# Patient Record
Sex: Male | Born: 1980
Health system: Southern US, Community
[De-identification: ages and names within clinical notes are randomized; demographics above are authoritative.]

## PROBLEM LIST (undated history)

## (undated) DIAGNOSIS — W3400XA Accidental discharge from unspecified firearms or gun, initial encounter: Secondary | ICD-10-CM

## (undated) DIAGNOSIS — A6000 Herpesviral infection of urogenital system, unspecified: Secondary | ICD-10-CM

## (undated) DIAGNOSIS — G43909 Migraine, unspecified, not intractable, without status migrainosus: Secondary | ICD-10-CM

## (undated) DIAGNOSIS — M549 Dorsalgia, unspecified: Secondary | ICD-10-CM

## (undated) DIAGNOSIS — R109 Unspecified abdominal pain: Secondary | ICD-10-CM

## (undated) DIAGNOSIS — M5430 Sciatica, unspecified side: Secondary | ICD-10-CM

## (undated) DIAGNOSIS — I10 Essential (primary) hypertension: Secondary | ICD-10-CM

## (undated) DIAGNOSIS — R112 Nausea with vomiting, unspecified: Secondary | ICD-10-CM

## (undated) DIAGNOSIS — K859 Acute pancreatitis without necrosis or infection, unspecified: Secondary | ICD-10-CM

## (undated) DIAGNOSIS — K529 Noninfective gastroenteritis and colitis, unspecified: Secondary | ICD-10-CM

## (undated) DIAGNOSIS — G8929 Other chronic pain: Secondary | ICD-10-CM

## (undated) HISTORY — DX: Noninfective gastroenteritis and colitis, unspecified: K52.9

## (undated) HISTORY — DX: Herpesviral infection of urogenital system, unspecified: A60.00

## (undated) HISTORY — PX: CHOLECYSTECTOMY: SHX55

---

## 2006-03-20 ENCOUNTER — Emergency Department (HOSPITAL_COMMUNITY): Admission: EM | Admit: 2006-03-20 | Discharge: 2006-03-20 | Payer: Self-pay | Admitting: Emergency Medicine

## 2006-04-27 ENCOUNTER — Emergency Department (HOSPITAL_COMMUNITY): Admission: EM | Admit: 2006-04-27 | Discharge: 2006-04-27 | Payer: Self-pay | Admitting: Emergency Medicine

## 2006-05-05 ENCOUNTER — Ambulatory Visit: Payer: Self-pay | Admitting: Internal Medicine

## 2006-05-05 ENCOUNTER — Inpatient Hospital Stay (HOSPITAL_COMMUNITY): Admission: EM | Admit: 2006-05-05 | Discharge: 2006-05-09 | Payer: Self-pay | Admitting: *Deleted

## 2006-05-09 ENCOUNTER — Ambulatory Visit: Payer: Self-pay | Admitting: Internal Medicine

## 2006-05-31 ENCOUNTER — Ambulatory Visit: Payer: Self-pay | Admitting: Internal Medicine

## 2006-06-01 ENCOUNTER — Emergency Department (HOSPITAL_COMMUNITY): Admission: EM | Admit: 2006-06-01 | Discharge: 2006-06-01 | Payer: Self-pay | Admitting: Emergency Medicine

## 2006-06-14 ENCOUNTER — Emergency Department (HOSPITAL_COMMUNITY): Admission: EM | Admit: 2006-06-14 | Discharge: 2006-06-14 | Payer: Self-pay | Admitting: Emergency Medicine

## 2011-10-21 ENCOUNTER — Emergency Department (HOSPITAL_COMMUNITY)
Admission: EM | Admit: 2011-10-21 | Discharge: 2011-10-21 | Disposition: A | Discharge status: Home / Self Care | Payer: Self-pay | Attending: Emergency Medicine | Admitting: Emergency Medicine

## 2011-10-21 ENCOUNTER — Emergency Department (HOSPITAL_COMMUNITY): Payer: Self-pay

## 2011-10-21 ENCOUNTER — Encounter (HOSPITAL_COMMUNITY): Payer: Self-pay | Admitting: *Deleted

## 2011-10-21 DIAGNOSIS — R109 Unspecified abdominal pain: Secondary | ICD-10-CM | POA: Insufficient documentation

## 2011-10-21 DIAGNOSIS — L538 Other specified erythematous conditions: Secondary | ICD-10-CM | POA: Insufficient documentation

## 2011-10-21 DIAGNOSIS — R112 Nausea with vomiting, unspecified: Secondary | ICD-10-CM | POA: Insufficient documentation

## 2011-10-21 DIAGNOSIS — R1013 Epigastric pain: Secondary | ICD-10-CM | POA: Insufficient documentation

## 2011-10-21 DIAGNOSIS — L304 Erythema intertrigo: Secondary | ICD-10-CM

## 2011-10-21 LAB — COMPREHENSIVE METABOLIC PANEL
ALT: 16 U/L (ref 0–53)
AST: 19 U/L (ref 0–37)
Albumin: 3.8 g/dL (ref 3.5–5.2)
Alkaline Phosphatase: 78 U/L (ref 39–117)
BUN: 11 mg/dL (ref 6–23)
CO2: 28 mEq/L (ref 19–32)
Calcium: 9.3 mg/dL (ref 8.4–10.5)
Chloride: 103 mEq/L (ref 96–112)
Creatinine, Ser: 1.14 mg/dL (ref 0.50–1.35)
GFR calc Af Amer: >90 mL/min (ref >90)
GFR calc non Af Amer: 86 mL/min — ABNORMAL LOW (ref >90)
Glucose, Bld: 90 mg/dL (ref 70–99)
Potassium: 4 mEq/L (ref 3.5–5.1)
Sodium: 139 mEq/L (ref 135–145)
Total Bilirubin: 0.2 mg/dL — ABNORMAL LOW (ref 0.3–1.2)
Total Protein: 7.8 g/dL (ref 6.0–8.3)

## 2011-10-21 LAB — LIPASE, BLOOD: Lipase: 80 U/L — ABNORMAL HIGH (ref 11–59)

## 2011-10-21 LAB — CBC
HCT: 41.5 % (ref 39.0–52.0)
Hemoglobin: 14.3 g/dL (ref 13.0–17.0)
MCH: 29.9 pg (ref 26.0–34.0)
MCHC: 34.5 g/dL (ref 30.0–36.0)
MCV: 86.8 fL (ref 78.0–100.0)
Platelets: 217 10*3/uL (ref 150–400)
RBC: 4.78 MIL/uL (ref 4.22–5.81)
RDW: 12.4 % (ref 11.5–15.5)
WBC: 9.3 10*3/uL (ref 4.0–10.5)

## 2011-10-21 LAB — DIFFERENTIAL
Basophils Absolute: 0 10*3/uL (ref 0.0–0.1)
Basophils Relative: 0 % (ref 0–1)
Eosinophils Absolute: 0.3 10*3/uL (ref 0.0–0.7)
Eosinophils Relative: 3 % (ref 0–5)
Lymphocytes Relative: 40 % (ref 12–46)
Lymphs Abs: 3.7 10*3/uL (ref 0.7–4.0)
Monocytes Absolute: 1 10*3/uL (ref 0.1–1.0)
Monocytes Relative: 10 % (ref 3–12)
Neutro Abs: 4.4 10*3/uL (ref 1.7–7.7)
Neutrophils Relative %: 47 % (ref 43–77)

## 2011-10-21 LAB — RPR: RPR Ser Ql: NONREACTIVE

## 2011-10-21 MED ORDER — HYDROMORPHONE HCL PF 1 MG/ML IJ SOLN
1.0000 mg | Freq: Once | INTRAMUSCULAR | Status: AC
  Administered 2011-10-21: 1 mg via INTRAVENOUS
  Filled 2011-10-21: qty 1

## 2011-10-21 MED ORDER — HYDROMORPHONE HCL PF 1 MG/ML IJ SOLN: 1.0000 mg | Freq: Once | INTRAMUSCULAR | Status: DC

## 2011-10-21 MED ORDER — HYDROCODONE-ACETAMINOPHEN 7.5-325 MG/15ML PO SOLN: 15.0000 mL | ORAL | Status: AC | PRN

## 2011-10-21 MED ORDER — CLOTRIMAZOLE 1 % EX CREA: TOPICAL_CREAM | CUTANEOUS | Status: AC

## 2011-10-21 MED ORDER — SODIUM CHLORIDE 0.9 % IV BOLUS (SEPSIS)
1000.0000 mL | Freq: Once | INTRAVENOUS | Status: AC
  Administered 2011-10-21: 1000 mL via INTRAVENOUS

## 2011-10-21 MED ORDER — ONDANSETRON HCL 4 MG/2ML IJ SOLN
4.0000 mg | Freq: Once | INTRAMUSCULAR | Status: AC
  Administered 2011-10-21: 4 mg via INTRAVENOUS

## 2011-10-21 MED ORDER — IOHEXOL 300 MG/ML  SOLN
100.0000 mL | Freq: Once | INTRAMUSCULAR | Status: AC | PRN
  Administered 2011-10-21: 100 mL via INTRAVENOUS

## 2011-10-21 MED ORDER — IOHEXOL 300 MG/ML  SOLN
20.0000 mL | INTRAMUSCULAR | Status: AC
  Administered 2011-10-21: 20 mL via ORAL

## 2011-10-21 MED ORDER — ONDANSETRON HCL 4 MG/2ML IJ SOLN
4.0000 mg | Freq: Once | INTRAMUSCULAR | Status: DC
  Filled 2011-10-21: qty 2

## 2011-10-21 MED ORDER — DIPHENHYDRAMINE HCL 50 MG/ML IJ SOLN
25.0000 mg | Freq: Once | INTRAMUSCULAR | Status: AC
  Administered 2011-10-21: 25 mg via INTRAVENOUS
  Filled 2011-10-21: qty 1

## 2011-10-21 MED ORDER — ONDANSETRON HCL 4 MG/2ML IJ SOLN
4.0000 mg | Freq: Once | INTRAMUSCULAR | Status: AC
  Administered 2011-10-21: 4 mg via INTRAVENOUS
  Filled 2011-10-21: qty 2

## 2011-10-21 MED ORDER — PROMETHAZINE HCL 25 MG PO TABS: 25.0000 mg | ORAL_TABLET | Freq: Four times a day (QID) | ORAL | Status: DC | PRN

## 2011-10-21 NOTE — ED Notes (Signed)
Received pt to room 20 from stretcher, report received from [place], [place]

## 2011-10-21 NOTE — ED Notes (Signed)
Pt c/o abd pain for 2-3 days with nausea and vomiting.  History of  Pancreatitis  From gall bladder removal

## 2011-10-21 NOTE — ED Notes (Signed)
Pt finished drinking po contrast, notified CT. Pt actively vomiting after pain meds given

## 2011-10-21 NOTE — ED Notes (Signed)
Notified Schorr, NP that pt c/o abd pain 10/10. New orders received.

## 2011-10-21 NOTE — ED Notes (Signed)
Pt to ED c/o peri-umbilical pain that radiates to L and R flank and emesis x 3 hours.  Pt does not have his gall bladder and has a hx of pancreatitis.

## 2011-10-23 NOTE — ED Provider Notes (Signed)
History     CSN: [id number]  Arrival date & time 10/21/11  0114   First MD Initiated Contact with Patient 10/21/11 0231      Chief Complaint  Patient presents with  . Abdominal Pain     Patient is a 31 y.o. male presenting with abdominal pain. The history is provided by the patient.  Abdominal Pain The primary symptoms of the illness include abdominal pain, nausea and vomiting. The primary symptoms of the illness do not include fever or diarrhea. The current episode started 3 to 5 hours ago. The onset of the illness was gradual. The problem has been gradually worsening.  The patient has not had a change in bowel habit. Symptoms associated with the illness do not include chills.  Patient reports onset abdominal pain at approximately 11:00 tonight. States the pain is epigastric and radiates to the left. Has had persistent nausea and vomiting since onset of symptoms. Patient reports history of pancreatitis status post gallbladder removal in 2007. States symptoms are very similar. Patient actively vomiting during assessment.  History reviewed. No pertinent past medical history.  History reviewed. No pertinent past surgical history.  No family history on file.  History  Substance Use Topics  . Smoking status: Never Smoker   . Smokeless tobacco: Not on file  . Alcohol Use: No      Review of Systems  Constitutional: Negative.  Negative for fever and chills.  HENT: Negative.   Eyes: Negative.   Respiratory: Negative.   Cardiovascular: Negative.   Gastrointestinal: Positive for nausea, vomiting and abdominal pain. Negative for diarrhea.  Genitourinary: Negative.   Musculoskeletal: Negative.   Skin: Negative.   Neurological: Negative.   Hematological: Negative.   Psychiatric/Behavioral: Negative.     Allergies  Review of patient's allergies indicates no known allergies.  Home Medications   Current Outpatient Rx  Name Route Sig Dispense Refill  . CLOTRIMAZOLE 1 % EX CREA   Apply to affected area 2 times daily to 2 to 4 weeks 15 g 1  . HYDROCODONE-ACETAMINOPHEN 7.5-325 MG/15ML PO SOLN Oral Take 15 mLs by mouth every 4 (four) hours as needed for pain. 75 mL 0  . PROMETHAZINE HCL 25 MG PO TABS Oral Take 1 tablet (25 mg total) by mouth every 6 (six) hours as needed for nausea. 10 tablet 0    BP 126/93  Pulse 59  Temp(Src) 97.9 F (36.6 C) (Oral)  Resp 18  SpO2 100%  Physical Exam  Constitutional: He appears well-developed and well-nourished.  HENT:  Head: Normocephalic and atraumatic.  Eyes: Conjunctivae are normal.  Neck: Neck supple.  Cardiovascular: Normal rate and regular rhythm.   Pulmonary/Chest: Effort normal and breath sounds normal.  Abdominal: Soft. Bowel sounds are normal. There is tenderness in the epigastric area and left upper quadrant.    Musculoskeletal: Normal range of motion.  Neurological: He is alert.  Skin: Skin is warm and dry.  Psychiatric: He has a normal mood and affect.    ED Course  Procedures days and clinical impression discussed with patient. There have been no further episodes of vomiting since initial arrival to the emergency department. Patient admits his abdominal pain has improved but has not completely resolved. At the time of discharge instructions patient request and he too examine a rash in his inguinal area that he has had for several weeks and causes intense itching. Noted slightly erythematous, shiny rash to the entire inguinal area consistent with intertrigo. Patient also complains of a generalized rash  predominantly to his torso and lower extremities that are dark colored macules without erythema or other signs of inflammation. Will draw RPR and referred to dermatology. Will plan to discharge patient home with medication for nausea a short course of pain medicine and treatment for his intertrigo. I will also provide PCP referrals and resources list and encourage patient to get established with a primary care  physician for his ongoing primary care needs. Patient is agreeable with plan. I have also discussed with patient with Dr. Nicanor Alcon who is in agreement with discharge plan  Labs Reviewed  COMPREHENSIVE METABOLIC PANEL - Abnormal; Notable for the following:    Total Bilirubin 0.2 (*)    GFR calc non Af Amer 86 (*)    All other components within normal limits  LIPASE, BLOOD - Abnormal; Notable for the following:    Lipase 80 (*)    All other components within normal limits  CBC  DIFFERENTIAL  RPR  LAB REPORT - SCANNED   No results found.   1. Abdominal pain   2. Nausea and vomiting in adult   3. Intertrigo       MDM  HPI/PE and clinical findings course c/w 1. Abdominal pain, nausea and vomiting without acute abdominal process (ct abd/pelvis w/cm w/o acute findings 2. Inguinal rash consistent with intertrigo. 3. Chronic nonspecific rash/ RPR negative        Leanne Chang, NP 10/23/11 [phone]

## 2011-10-23 NOTE — ED Provider Notes (Signed)
Medical screening examination/treatment/procedure(s) were performed by non-physician practitioner and as supervising physician I was immediately available for consultation/collaboration.  Jasmine Awe, MD 10/23/11 [phone]

## 2012-10-30 ENCOUNTER — Emergency Department (HOSPITAL_BASED_OUTPATIENT_CLINIC_OR_DEPARTMENT_OTHER)
Admission: EM | Admit: 2012-10-30 | Discharge: 2012-10-30 | Disposition: A | Discharge status: Home / Self Care | Payer: Self-pay | Attending: Emergency Medicine | Admitting: Emergency Medicine

## 2012-10-30 ENCOUNTER — Encounter (HOSPITAL_BASED_OUTPATIENT_CLINIC_OR_DEPARTMENT_OTHER): Payer: Self-pay | Admitting: *Deleted

## 2012-10-30 DIAGNOSIS — R109 Unspecified abdominal pain: Secondary | ICD-10-CM | POA: Insufficient documentation

## 2012-10-30 DIAGNOSIS — K859 Acute pancreatitis without necrosis or infection, unspecified: Secondary | ICD-10-CM | POA: Insufficient documentation

## 2012-10-30 HISTORY — DX: Acute pancreatitis without necrosis or infection, unspecified: K85.90

## 2012-10-30 LAB — URINALYSIS, ROUTINE W REFLEX MICROSCOPIC
Ketones, ur: 15 mg/dL — AB
Leukocytes, UA: NEGATIVE
Protein, ur: 30 mg/dL — AB
Urobilinogen, UA: 1 mg/dL (ref 0.0–1.0)

## 2012-10-30 LAB — COMPREHENSIVE METABOLIC PANEL
BUN: 8 mg/dL (ref 6–23)
CO2: 26 mEq/L (ref 19–32)
Calcium: 9 mg/dL (ref 8.4–10.5)
Creatinine, Ser: 1 mg/dL (ref 0.50–1.35)
GFR calc Af Amer: >90 mL/min (ref >90)
GFR calc non Af Amer: >90 mL/min (ref >90)
Glucose, Bld: 98 mg/dL (ref 70–99)
Total Bilirubin: 0.2 mg/dL — ABNORMAL LOW (ref 0.3–1.2)

## 2012-10-30 LAB — CBC WITH DIFFERENTIAL/PLATELET
Basophils Relative: 0 % (ref 0–1)
HCT: 41.1 % (ref 39.0–52.0)
Hemoglobin: 14.3 g/dL (ref 13.0–17.0)
Lymphs Abs: 2.1 10*3/uL (ref 0.7–4.0)
MCH: 29.8 pg (ref 26.0–34.0)
MCHC: 34.8 g/dL (ref 30.0–36.0)
MCV: 85.6 fL (ref 78.0–100.0)
Monocytes Absolute: 1.1 10*3/uL — ABNORMAL HIGH (ref 0.1–1.0)
Myelocytes: 1 %
Neutro Abs: 6.6 10*3/uL (ref 1.7–7.7)
Neutrophils Relative %: 64 % (ref 43–77)

## 2012-10-30 LAB — URINE MICROSCOPIC-ADD ON

## 2012-10-30 MED ORDER — SODIUM CHLORIDE 0.9 % IV SOLN
Freq: Once | INTRAVENOUS | Status: AC
  Administered 2012-10-30: 19:00:00 via INTRAVENOUS

## 2012-10-30 MED ORDER — OXYCODONE-ACETAMINOPHEN 5-325 MG PO TABS: 2.0000 | ORAL_TABLET | ORAL | Status: DC | PRN

## 2012-10-30 MED ORDER — FAMOTIDINE IN NACL 20-0.9 MG/50ML-% IV SOLN
20.0000 mg | Freq: Once | INTRAVENOUS | Status: AC
  Administered 2012-10-30: 20 mg via INTRAVENOUS
  Filled 2012-10-30: qty 50

## 2012-10-30 MED ORDER — PROMETHAZINE HCL 25 MG PO TABS: 25.0000 mg | ORAL_TABLET | Freq: Four times a day (QID) | ORAL | Status: DC | PRN

## 2012-10-30 MED ORDER — HYDROMORPHONE HCL PF 1 MG/ML IJ SOLN
1.0000 mg | Freq: Once | INTRAMUSCULAR | Status: AC
  Administered 2012-10-30: 1 mg via INTRAVENOUS
  Filled 2012-10-30: qty 1

## 2012-10-30 MED ORDER — ONDANSETRON HCL 4 MG/2ML IJ SOLN
4.0000 mg | Freq: Once | INTRAMUSCULAR | Status: AC
  Administered 2012-10-30: 4 mg via INTRAVENOUS
  Filled 2012-10-30: qty 2

## 2012-10-30 NOTE — ED Notes (Signed)
IV infusion continues at this time, IV site unremarkable. Pt given crackers per pt request.

## 2012-10-30 NOTE — ED Notes (Signed)
Vomiting. Hx of pancreatitis.

## 2012-10-30 NOTE — ED Provider Notes (Signed)
Medical screening examination/treatment/procedure(s) were performed by non-physician practitioner and as supervising physician I was immediately available for consultation/collaboration.    Nelia Shi, MD 10/30/12 2100

## 2012-10-30 NOTE — ED Provider Notes (Signed)
History     CSN: [id number]  Arrival date & time 10/30/12  1746   First MD Initiated Contact with Patient 10/30/12 1804      Chief Complaint  Patient presents with  . Emesis    (Consider location/radiation/quality/duration/timing/severity/associated sxs/prior treatment) Patient is a 32 y.o. male presenting with abdominal pain. The history is provided by the patient. No language interpreter was used.  Abdominal Pain The primary symptoms of the illness include abdominal pain. The current episode started more than 2 days ago. The onset of the illness was gradual. The problem has been gradually worsening.  The illness is associated with eating. The patient has not had a change in bowel habit. Associated medical issues comments: hx of pancreatitis.   Pt reports only small amount of alcohol consumption Past Medical History  Diagnosis Date  . Pancreatitis     Past Surgical History  Procedure Date  . Cholecystectomy     No family history on file.  History  Substance Use Topics  . Smoking status: Never Smoker   . Smokeless tobacco: Not on file  . Alcohol Use: Yes      Review of Systems  Gastrointestinal: Positive for abdominal pain.  All other systems reviewed and are negative.    Allergies  Review of patient's allergies indicates no known allergies.  Home Medications  No current outpatient prescriptions on file.  BP 139/85  Pulse 92  Temp 98.8 F (37.1 C) (Oral)  Resp 22  SpO2 100%  Physical Exam  Nursing note and vitals reviewed. Constitutional: He appears well-developed and well-nourished.  HENT:  Head: Normocephalic and atraumatic.  Right Ear: External ear normal.  Left Ear: External ear normal.  Nose: Nose normal.  Mouth/Throat: Oropharynx is clear and moist.  Eyes: Conjunctivae normal and EOM are normal. Pupils are equal, round, and reactive to light.  Neck: Normal range of motion.  Cardiovascular: Normal rate and normal heart sounds.     Pulmonary/Chest: Effort normal.  Abdominal: Soft. There is tenderness.  Musculoskeletal: Normal range of motion.  Neurological: He is alert.  Skin: Skin is warm.  Psychiatric: He has a normal mood and affect.    ED Course  Procedures (including critical care time)  Labs Reviewed  COMPREHENSIVE METABOLIC PANEL - Abnormal; Notable for the following:    Potassium 3.0 (*)     Total Bilirubin 0.2 (*)     All other components within normal limits  LIPASE, BLOOD - Abnormal; Notable for the following:    Lipase 132 (*)     All other components within normal limits  URINALYSIS, ROUTINE W REFLEX MICROSCOPIC - Abnormal; Notable for the following:    APPearance CLOUDY (*)     Specific Gravity, Urine 1.031 (*)     Bilirubin Urine SMALL (*)     Ketones, ur 15 (*)     Protein, ur 30 (*)     All other components within normal limits  URINE MICROSCOPIC-ADD ON - Abnormal; Notable for the following:    Squamous Epithelial / LPF FEW (*)     All other components within normal limits  CBC WITH DIFFERENTIAL   No results found.   No diagnosis found.    MDM  Pt given Iv fluids, dilaudid and zofran.    Pt given primary care referral list.   Rx for phenergan and percocet.   Pt advised clear liquids for 24 hours.         Lonia Skinner [place], [place] 10/30/12 [phone]

## 2012-11-01 ENCOUNTER — Emergency Department (HOSPITAL_COMMUNITY)
Admission: EM | Admit: 2012-11-01 | Discharge: 2012-11-01 | Disposition: A | Discharge status: Home / Self Care | Payer: Self-pay | Attending: Emergency Medicine | Admitting: Emergency Medicine

## 2012-11-01 ENCOUNTER — Encounter (HOSPITAL_COMMUNITY): Payer: Self-pay | Admitting: *Deleted

## 2012-11-01 DIAGNOSIS — Z9049 Acquired absence of other specified parts of digestive tract: Secondary | ICD-10-CM

## 2012-11-01 DIAGNOSIS — Z9089 Acquired absence of other organs: Secondary | ICD-10-CM | POA: Insufficient documentation

## 2012-11-01 DIAGNOSIS — R197 Diarrhea, unspecified: Secondary | ICD-10-CM | POA: Insufficient documentation

## 2012-11-01 DIAGNOSIS — Z87891 Personal history of nicotine dependence: Secondary | ICD-10-CM | POA: Insufficient documentation

## 2012-11-01 DIAGNOSIS — K859 Acute pancreatitis without necrosis or infection, unspecified: Secondary | ICD-10-CM | POA: Insufficient documentation

## 2012-11-01 DIAGNOSIS — I10 Essential (primary) hypertension: Secondary | ICD-10-CM | POA: Insufficient documentation

## 2012-11-01 DIAGNOSIS — R112 Nausea with vomiting, unspecified: Secondary | ICD-10-CM | POA: Insufficient documentation

## 2012-11-01 HISTORY — DX: Essential (primary) hypertension: I10

## 2012-11-01 LAB — CBC WITH DIFFERENTIAL/PLATELET
Eosinophils Relative: 1 % (ref 0–5)
HCT: 43.3 % (ref 39.0–52.0)
Lymphs Abs: 2.2 10*3/uL (ref 0.7–4.0)
MCH: 29.8 pg (ref 26.0–34.0)
MCV: 85.6 fL (ref 78.0–100.0)
Monocytes Absolute: 1.1 10*3/uL — ABNORMAL HIGH (ref 0.1–1.0)
Neutro Abs: 9.2 10*3/uL — ABNORMAL HIGH (ref 1.7–7.7)
Platelets: 243 10*3/uL (ref 150–400)
RBC: 5.06 MIL/uL (ref 4.22–5.81)

## 2012-11-01 LAB — COMPREHENSIVE METABOLIC PANEL
AST: 26 U/L (ref 0–37)
BUN: 4 mg/dL — ABNORMAL LOW (ref 6–23)
CO2: 24 mEq/L (ref 19–32)
Calcium: 9.1 mg/dL (ref 8.4–10.5)
Chloride: 104 mEq/L (ref 96–112)
Creatinine, Ser: 1.01 mg/dL (ref 0.50–1.35)
GFR calc Af Amer: >90 mL/min (ref >90)
GFR calc non Af Amer: >90 mL/min (ref >90)
Glucose, Bld: 87 mg/dL (ref 70–99)
Total Bilirubin: 0.3 mg/dL (ref 0.3–1.2)

## 2012-11-01 LAB — URINALYSIS, ROUTINE W REFLEX MICROSCOPIC
Ketones, ur: 15 mg/dL — AB
Leukocytes, UA: NEGATIVE
Nitrite: NEGATIVE
Protein, ur: 30 mg/dL — AB
Urobilinogen, UA: 0.2 mg/dL (ref 0.0–1.0)

## 2012-11-01 LAB — URINE MICROSCOPIC-ADD ON

## 2012-11-01 LAB — LIPASE, BLOOD: Lipase: 82 U/L — ABNORMAL HIGH (ref 11–59)

## 2012-11-01 MED ORDER — POTASSIUM CHLORIDE CRYS ER 20 MEQ PO TBCR
40.0000 meq | EXTENDED_RELEASE_TABLET | Freq: Once | ORAL | Status: AC
  Administered 2012-11-01: 40 meq via ORAL
  Filled 2012-11-01: qty 2

## 2012-11-01 MED ORDER — HYDROMORPHONE HCL PF 1 MG/ML IJ SOLN
1.0000 mg | Freq: Once | INTRAMUSCULAR | Status: AC
  Administered 2012-11-01: 1 mg via INTRAVENOUS
  Filled 2012-11-01: qty 1

## 2012-11-01 MED ORDER — ONDANSETRON HCL 4 MG PO TABS: 4.0000 mg | ORAL_TABLET | Freq: Four times a day (QID) | ORAL | Status: DC

## 2012-11-01 MED ORDER — OXYCODONE-ACETAMINOPHEN 5-325 MG PO TABS: 2.0000 | ORAL_TABLET | ORAL | Status: DC | PRN

## 2012-11-01 MED ORDER — ONDANSETRON 4 MG PO TBDP
ORAL_TABLET | ORAL | Status: AC
  Filled 2012-11-01: qty 2

## 2012-11-01 MED ORDER — SODIUM CHLORIDE 0.9 % IV SOLN: 1000.0000 mL | INTRAVENOUS | Status: DC

## 2012-11-01 MED ORDER — SODIUM CHLORIDE 0.9 % IV SOLN
1000.0000 mL | Freq: Once | INTRAVENOUS | Status: AC
  Administered 2012-11-01: 1000 mL via INTRAVENOUS

## 2012-11-01 MED ORDER — SODIUM CHLORIDE 0.9 % IV SOLN: 1000.0000 mL | Freq: Once | INTRAVENOUS | Status: DC

## 2012-11-01 MED ORDER — ONDANSETRON 4 MG PO TBDP
8.0000 mg | ORAL_TABLET | Freq: Once | ORAL | Status: AC
  Administered 2012-11-01: 8 mg via ORAL

## 2012-11-01 MED ORDER — ONDANSETRON HCL 4 MG/2ML IJ SOLN
4.0000 mg | Freq: Once | INTRAMUSCULAR | Status: AC
  Administered 2012-11-01: 4 mg via INTRAVENOUS
  Filled 2012-11-01: qty 2

## 2012-11-01 NOTE — ED Provider Notes (Signed)
Medical screening examination/treatment/procedure(s) were performed by non-physician practitioner and as supervising physician I was immediately available for consultation/collaboration.   Richardean Canal, MD 11/01/12 [phone]

## 2012-11-01 NOTE — ED Notes (Signed)
Discharge instructions reviewed. Diet instructions reviewed. Pt verbalized understanding.

## 2012-11-01 NOTE — ED Provider Notes (Signed)
History     CSN: [id number]  Arrival date & time 11/01/12  1301   First MD Initiated Contact with Patient 11/01/12 1543      No chief complaint on file.   (Consider location/radiation/quality/duration/timing/severity/associated sxs/prior treatment) The history is provided by the patient and medical records.    Jeremy Bell is a 32 y.o. male  with a hx of pancreatitis presents to the Emergency Department complaining of gradual, persistent, progressively worsening abdominal pain onset 4 days ago.  Pt seen at [facility] on 10/30/12 with Lipase 132 and they encouraged admission, but pt refused.  He has been taking oxycodone, phenergan at home without relief.  Pt states that he has had persistent diarrhea, vomiting and abdominal pain.  He states abdominal pain comes in waves.  He states this morning he also saw dried blood in his underwear but has not seen blood at other times. Associated symptoms include nausea, vomiting, diarrhea, abdominal pain.  Nothing makes it better and nothing makes it worse.  Pt denies fever, chills, headache, chest pain, shortness of breath, weakness, syncope, dysuria, hematuria.       Past Medical History  Diagnosis Date  . Pancreatitis   . Hypertension     Past Surgical History  Procedure Date  . Cholecystectomy     No family history on file.  History  Substance Use Topics  . Smoking status: Former Games developer  . Smokeless tobacco: Not on file  . Alcohol Use: Yes     Comment: occasionally      Review of Systems  Constitutional: Negative for fever, diaphoresis, appetite change, fatigue and unexpected weight change.  HENT: Negative for mouth sores, trouble swallowing, neck pain and neck stiffness.   Respiratory: Negative for cough, chest tightness, shortness of breath, wheezing and stridor.   Cardiovascular: Negative for chest pain and palpitations.  Gastrointestinal: Positive for nausea, vomiting, abdominal pain and diarrhea. Negative for constipation,  blood in stool, abdominal distention and rectal pain.  Genitourinary: Negative for dysuria, urgency, frequency, hematuria, flank pain and difficulty urinating.  Musculoskeletal: Negative for back pain.  Skin: Negative for rash.  Neurological: Negative for weakness.  Hematological: Negative for adenopathy.  Psychiatric/Behavioral: Negative for confusion.  All other systems reviewed and are negative.    Allergies  Other  Home Medications   Current Outpatient Rx  Name  Route  Sig  Dispense  Refill  . BISMUTH SUBSALICYLATE 262 MG/15ML PO SUSP   Oral   Take 30 mLs by mouth every 6 (six) hours as needed. For sick stomach         . PROMETHAZINE HCL 25 MG PO TABS   Oral   Take 1 tablet (25 mg total) by mouth every 6 (six) hours as needed for nausea.   30 tablet   0   . ONDANSETRON HCL 4 MG PO TABS   Oral   Take 1 tablet (4 mg total) by mouth every 6 (six) hours.   12 tablet   0   . OXYCODONE-ACETAMINOPHEN 5-325 MG PO TABS   Oral   Take 2 tablets by mouth every 4 (four) hours as needed for pain.   10 tablet   0     BP 127/67  Pulse 64  Temp 98.4 F (36.9 C) (Oral)  Resp 16  SpO2 98%  Physical Exam  Nursing note and vitals reviewed. Constitutional: He is oriented to person, place, and time. He appears well-developed and well-nourished.  HENT:  Head: Normocephalic and atraumatic.  Right Ear: Tympanic  membrane, external ear and ear canal normal.  Left Ear: Tympanic membrane, external ear and ear canal normal.  Nose: Nose normal. Right sinus exhibits no maxillary sinus tenderness and no frontal sinus tenderness. Left sinus exhibits no maxillary sinus tenderness and no frontal sinus tenderness.  Mouth/Throat: Uvula is midline, oropharynx is clear and moist and mucous membranes are normal. No oropharyngeal exudate, posterior oropharyngeal edema, posterior oropharyngeal erythema or tonsillar abscesses.  Eyes: Conjunctivae normal are normal. Pupils are equal, round, and  reactive to light. No scleral icterus.  Neck: Normal range of motion.  Cardiovascular: Normal rate, regular rhythm, normal heart sounds and intact distal pulses.  Exam reveals no gallop and no friction rub.   No murmur heard. Pulmonary/Chest: Effort normal and breath sounds normal. No respiratory distress. He has no wheezes. He has no rales. He exhibits no tenderness.  Abdominal: Soft. Normal appearance and bowel sounds are normal. He exhibits no distension and no mass. There is no hepatosplenomegaly. There is tenderness in the right upper quadrant, epigastric area and left upper quadrant. There is guarding. There is no rigidity, no rebound, no CVA tenderness, no tenderness at McBurney's point and negative Murphy's sign.  Genitourinary: Rectum normal. Rectal exam shows no external hemorrhoid, no internal hemorrhoid, no fissure, no mass, no tenderness and anal tone normal. Guaiac negative stool. Prostate is not enlarged and not tender.  Musculoskeletal: Normal range of motion. He exhibits no edema and no tenderness.  Lymphadenopathy:    He has no cervical adenopathy.  Neurological: He is alert and oriented to person, place, and time. He exhibits normal muscle tone. Coordination normal.  Skin: Skin is warm and dry. No rash noted. No erythema.  Psychiatric: He has a normal mood and affect.    ED Course  Procedures (including critical care time)  Labs Reviewed  CBC WITH DIFFERENTIAL - Abnormal; Notable for the following:    WBC 12.7 (*)     Neutro Abs 9.2 (*)     Monocytes Absolute 1.1 (*)     All other components within normal limits  COMPREHENSIVE METABOLIC PANEL - Abnormal; Notable for the following:    Potassium 3.0 (*)     BUN 4 (*)     All other components within normal limits  LIPASE, BLOOD - Abnormal; Notable for the following:    Lipase 82 (*)     All other components within normal limits  URINALYSIS, ROUTINE W REFLEX MICROSCOPIC - Abnormal; Notable for the following:    Color,  Urine AMBER (*)  BIOCHEMICALS MAY BE AFFECTED BY COLOR   APPearance CLOUDY (*)     Bilirubin Urine SMALL (*)     Ketones, ur 15 (*)     Protein, ur 30 (*)     All other components within normal limits  URINE MICROSCOPIC-ADD ON - Abnormal; Notable for the following:    Casts HYALINE CASTS (*)     All other components within normal limits   No results found.   1. Pancreatitis   2. History of cholecystectomy       MDM  Lonzo Cloud presents with Hx of pancreatitis and persistent abdominal pain, nausea and vomiting. Lipase elevated, but below 120.  Will medicate, give IV fluids and do a PO trial.    Patient tolerating by mouth fluids without difficulty.  Patient states pain is returning will redoes Dilaudid. I discussed the patient must maintain a clear liquid diet and followup with gastroenterology tomorrow morning.  1. Medications: oxycodone, zofran, usual home  medications  2. Treatment: rest, drink plenty of fluids, clear liquid diet; advance to the BRAT diet as tolerated  3. Follow Up: Please followup with your primary doctor for discussion of your diagnoses and further evaluation after today's visit; if you do not have a primary care doctor use the resource guide provided to find one; f/u with gastroenterology         Dierdre Forth, PA-C 11/01/12 [phone]

## 2012-11-01 NOTE — ED Notes (Signed)
Pt states that he awakened today with 1 moderate amount of blood in pants and has been having diarrhea and vomiting.

## 2012-11-01 NOTE — ED Notes (Signed)
Pt with hx of acute pancreatitis tx at [facility] for acute pancreatitis.  They wanted to send pt here,but he was afraid to come to hospital.  Pt has been taking oxycodene, phenergan and a clear liquid diet with no relief of s/s.  Today pt awoke with dried blood in his pants and his diarrhea has been dark and watery.

## 2013-05-13 ENCOUNTER — Emergency Department (HOSPITAL_BASED_OUTPATIENT_CLINIC_OR_DEPARTMENT_OTHER)
Admission: EM | Admit: 2013-05-13 | Discharge: 2013-05-13 | Disposition: A | Discharge status: Home / Self Care | Payer: Self-pay | Attending: Emergency Medicine | Admitting: Emergency Medicine

## 2013-05-13 ENCOUNTER — Emergency Department (HOSPITAL_BASED_OUTPATIENT_CLINIC_OR_DEPARTMENT_OTHER): Payer: Self-pay

## 2013-05-13 DIAGNOSIS — I10 Essential (primary) hypertension: Secondary | ICD-10-CM | POA: Insufficient documentation

## 2013-05-13 DIAGNOSIS — Z8719 Personal history of other diseases of the digestive system: Secondary | ICD-10-CM | POA: Insufficient documentation

## 2013-05-13 DIAGNOSIS — M549 Dorsalgia, unspecified: Secondary | ICD-10-CM | POA: Insufficient documentation

## 2013-05-13 DIAGNOSIS — R21 Rash and other nonspecific skin eruption: Secondary | ICD-10-CM | POA: Insufficient documentation

## 2013-05-13 DIAGNOSIS — Z87891 Personal history of nicotine dependence: Secondary | ICD-10-CM | POA: Insufficient documentation

## 2013-05-13 LAB — CBC WITH DIFFERENTIAL/PLATELET
Basophils Absolute: 0 10*3/uL (ref 0.0–0.1)
Basophils Relative: 0 % (ref 0–1)
Eosinophils Absolute: 0.1 10*3/uL (ref 0.0–0.7)
HCT: 39.2 % (ref 39.0–52.0)
Hemoglobin: 13.4 g/dL (ref 13.0–17.0)
MCH: 29.9 pg (ref 26.0–34.0)
MCHC: 34.2 g/dL (ref 30.0–36.0)
Monocytes Absolute: 0.5 10*3/uL (ref 0.1–1.0)
Monocytes Relative: 7 % (ref 3–12)
Neutrophils Relative %: 67 % (ref 43–77)
RDW: 11.3 % — ABNORMAL LOW (ref 11.5–15.5)

## 2013-05-13 LAB — URINE MICROSCOPIC-ADD ON

## 2013-05-13 LAB — SEDIMENTATION RATE: Sed Rate: 17 mm/hr — ABNORMAL HIGH (ref 0–16)

## 2013-05-13 LAB — URINALYSIS, ROUTINE W REFLEX MICROSCOPIC
Glucose, UA: NEGATIVE mg/dL
Hgb urine dipstick: NEGATIVE
Protein, ur: NEGATIVE mg/dL
Specific Gravity, Urine: 1.029 (ref 1.005–1.030)

## 2013-05-13 LAB — COMPREHENSIVE METABOLIC PANEL
AST: 19 U/L (ref 0–37)
Albumin: 3.9 g/dL (ref 3.5–5.2)
BUN: 12 mg/dL (ref 6–23)
Calcium: 9.7 mg/dL (ref 8.4–10.5)
Creatinine, Ser: 1 mg/dL (ref 0.50–1.35)
Total Protein: 7.7 g/dL (ref 6.0–8.3)

## 2013-05-13 LAB — AMYLASE: Amylase: 81 U/L (ref 0–105)

## 2013-05-13 MED ORDER — OXYCODONE-ACETAMINOPHEN 5-325 MG PO TABS
2.0000 | ORAL_TABLET | Freq: Once | ORAL | Status: DC
  Filled 2013-05-13: qty 2

## 2013-05-13 MED ORDER — ONDANSETRON HCL 4 MG/2ML IJ SOLN
4.0000 mg | Freq: Once | INTRAMUSCULAR | Status: AC
  Administered 2013-05-13: 4 mg via INTRAVENOUS
  Filled 2013-05-13: qty 2

## 2013-05-13 MED ORDER — KETOROLAC TROMETHAMINE 30 MG/ML IJ SOLN
INTRAMUSCULAR | Status: AC
  Filled 2013-05-13: qty 1

## 2013-05-13 MED ORDER — OXYCODONE-ACETAMINOPHEN 5-325 MG PO TABS: 2.0000 | ORAL_TABLET | ORAL | Status: DC | PRN

## 2013-05-13 MED ORDER — HYDROMORPHONE HCL PF 1 MG/ML IJ SOLN
1.0000 mg | Freq: Once | INTRAMUSCULAR | Status: AC
  Administered 2013-05-13: 1 mg via INTRAVENOUS
  Filled 2013-05-13: qty 1

## 2013-05-13 MED ORDER — KETOROLAC TROMETHAMINE 30 MG/ML IJ SOLN
30.0000 mg | Freq: Once | INTRAMUSCULAR | Status: AC
  Administered 2013-05-13: 30 mg via INTRAVENOUS

## 2013-05-13 MED ORDER — KETOROLAC TROMETHAMINE 60 MG/2ML IM SOLN: 60.0000 mg | Freq: Once | INTRAMUSCULAR | Status: DC

## 2013-05-13 MED ORDER — DOXYCYCLINE HYCLATE 100 MG PO CAPS: 100.0000 mg | ORAL_CAPSULE | Freq: Two times a day (BID) | ORAL | Status: DC

## 2013-05-13 NOTE — ED Notes (Signed)
Pt states he is still having significant pain states it was getting a little bit better then when went to radiology and was moving to table began hurting severely again. Will notify provider of increase in pain

## 2013-05-13 NOTE — ED Provider Notes (Signed)
Medical screening examination/treatment/procedure(s) were performed by non-physician practitioner and as supervising physician I was immediately available for consultation/collaboration.   William Sharonlee Nine, MD 05/13/13 2342 

## 2013-05-13 NOTE — ED Provider Notes (Signed)
CSN: [id number]     Arrival date & time 05/13/13  1602 History     First MD Initiated Contact with Patient 05/13/13 1622     Chief Complaint  Patient presents with  . Back Pain   (Consider location/radiation/quality/duration/timing/severity/associated sxs/prior Treatment) Patient is a 32 y.o. male presenting with back pain. The history is provided by the patient. No language interpreter was used.  Back Pain Location:  Lumbar spine Quality:  Aching and stabbing Pain severity:  Severe Pain is:  Same all the time Duration:  1 week Timing:  Constant Progression:  Worsening Relieved by:  Nothing Worsened by:  Nothing tried Associated symptoms: no numbness   Pt also complains of a rash to his lower legs,  Rash is painful,  Areas come and go  Past Medical History  Diagnosis Date  . Pancreatitis   . Hypertension    Past Surgical History  Procedure Laterality Date  . Cholecystectomy     No family history on file. History  Substance Use Topics  . Smoking status: Former Games developer  . Smokeless tobacco: Not on file  . Alcohol Use: Yes     Comment: occasionally    Review of Systems  Musculoskeletal: Positive for back pain.  Skin: Positive for rash.  Neurological: Negative for numbness.  All other systems reviewed and are negative.    Allergies  Other  Home Medications   Current Outpatient Rx  Name  Route  Sig  Dispense  Refill  . bismuth subsalicylate (PEPTO BISMOL) 262 MG/15ML suspension   Oral   Take 30 mLs by mouth every 6 (six) hours as needed. For sick stomach         . ondansetron (ZOFRAN) 4 MG tablet   Oral   Take 1 tablet (4 mg total) by mouth every 6 (six) hours.   12 tablet   0   . oxyCODONE-acetaminophen (PERCOCET/ROXICET) 5-325 MG per tablet   Oral   Take 2 tablets by mouth every 4 (four) hours as needed for pain.   10 tablet   0   . promethazine (PHENERGAN) 25 MG tablet   Oral   Take 1 tablet (25 mg total) by mouth every 6 (six) hours as needed  for nausea.   30 tablet   0    BP 154/85  Pulse 60  Resp 16  Ht 6\' 2"  (1.88 m)  Wt 225 lb (102.059 kg)  BMI 28.88 kg/m2  SpO2 100% Physical Exam  Constitutional: He appears well-developed and well-nourished.  HENT:  Head: Normocephalic and atraumatic.  Eyes: Conjunctivae are normal. Pupils are equal, round, and reactive to light.  Neck: Normal range of motion. Neck supple.  Cardiovascular: Normal rate and normal heart sounds.   Pulmonary/Chest: Effort normal and breath sounds normal.  Abdominal: Soft.  Musculoskeletal: Normal range of motion.  Neurological: He is alert.  Skin: Skin is warm.  Psychiatric: He has a normal mood and affect.    ED Course   Procedures (including critical care time)  Labs Reviewed  URINALYSIS, ROUTINE W REFLEX MICROSCOPIC - Abnormal; Notable for the following:    Leukocytes, UA TRACE (*)    All other components within normal limits  URINE MICROSCOPIC-ADD ON  CBC WITH DIFFERENTIAL  COMPREHENSIVE METABOLIC PANEL  Dr. Gwendolyn Grant in to see  Rash looks like erythema nodosum.  Pt has significant back pain.   I can not put the back pain and the rash together.   No results found. 1. Back pain   2.  Rash     MDM  Pt advised to see Dr. Pearletha Forge tomorrow. 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  Pt has significant back pain and may need an MRI.  Pt is to return to ED if Dr. Pearletha Forge can not see.  Lonia Skinner [place], PA-C 05/13/13 2257

## 2013-05-13 NOTE — ED Notes (Signed)
Pt. Reports leg swelling and back pain started last wk.. Pt. Reports low back to tail bone pain.Marland Kitchen Pt. Reports a pulling sensation in the R leg and into the low back with movement. Pt. Also has noted mild edema  And pinkish color to lower R leg.

## 2013-06-17 ENCOUNTER — Encounter (HOSPITAL_COMMUNITY): Payer: Self-pay | Admitting: Nurse Practitioner

## 2013-06-17 ENCOUNTER — Emergency Department (HOSPITAL_COMMUNITY)
Admission: EM | Admit: 2013-06-17 | Discharge: 2013-06-17 | Disposition: A | Discharge status: Home / Self Care | Payer: Self-pay | Attending: Emergency Medicine | Admitting: Emergency Medicine

## 2013-06-17 DIAGNOSIS — Z87828 Personal history of other (healed) physical injury and trauma: Secondary | ICD-10-CM | POA: Insufficient documentation

## 2013-06-17 DIAGNOSIS — M545 Low back pain, unspecified: Secondary | ICD-10-CM | POA: Insufficient documentation

## 2013-06-17 DIAGNOSIS — Z8719 Personal history of other diseases of the digestive system: Secondary | ICD-10-CM | POA: Insufficient documentation

## 2013-06-17 DIAGNOSIS — Z87891 Personal history of nicotine dependence: Secondary | ICD-10-CM | POA: Insufficient documentation

## 2013-06-17 DIAGNOSIS — Z8679 Personal history of other diseases of the circulatory system: Secondary | ICD-10-CM | POA: Insufficient documentation

## 2013-06-17 DIAGNOSIS — I10 Essential (primary) hypertension: Secondary | ICD-10-CM | POA: Insufficient documentation

## 2013-06-17 DIAGNOSIS — G8929 Other chronic pain: Secondary | ICD-10-CM | POA: Insufficient documentation

## 2013-06-17 HISTORY — DX: Dorsalgia, unspecified: M54.9

## 2013-06-17 HISTORY — DX: Migraine, unspecified, not intractable, without status migrainosus: G43.909

## 2013-06-17 HISTORY — DX: Other chronic pain: G89.29

## 2013-06-17 HISTORY — DX: Unspecified abdominal pain: R10.9

## 2013-06-17 HISTORY — DX: Accidental discharge from unspecified firearms or gun, initial encounter: W34.00XA

## 2013-06-17 HISTORY — DX: Nausea with vomiting, unspecified: R11.2

## 2013-06-17 LAB — CBC WITH DIFFERENTIAL/PLATELET
Basophils Absolute: 0.1 10*3/uL (ref 0.0–0.1)
Basophils Relative: 1 % (ref 0–1)
Eosinophils Relative: 1 % (ref 0–5)
HCT: 39.4 % (ref 39.0–52.0)
Hemoglobin: 13.3 g/dL (ref 13.0–17.0)
Lymphocytes Relative: 30 % (ref 12–46)
MCHC: 33.8 g/dL (ref 30.0–36.0)
Neutro Abs: 4.1 10*3/uL (ref 1.7–7.7)
Platelets: 239 10*3/uL (ref 150–400)
RBC: 4.53 MIL/uL (ref 4.22–5.81)
RDW: 12 % (ref 11.5–15.5)

## 2013-06-17 LAB — COMPREHENSIVE METABOLIC PANEL
AST: 20 U/L (ref 0–37)
Albumin: 4.1 g/dL (ref 3.5–5.2)
Calcium: 9.3 mg/dL (ref 8.4–10.5)
Creatinine, Ser: 0.93 mg/dL (ref 0.50–1.35)
GFR calc non Af Amer: >90 mL/min (ref >90)
Sodium: 134 mEq/L — ABNORMAL LOW (ref 135–145)
Total Protein: 8 g/dL (ref 6.0–8.3)

## 2013-06-17 LAB — URINALYSIS, ROUTINE W REFLEX MICROSCOPIC
Bilirubin Urine: NEGATIVE
Ketones, ur: NEGATIVE mg/dL
Nitrite: NEGATIVE
Urobilinogen, UA: 0.2 mg/dL (ref 0.0–1.0)
pH: 5.5 (ref 5.0–8.0)

## 2013-06-17 LAB — URINE MICROSCOPIC-ADD ON

## 2013-06-17 MED ORDER — METHOCARBAMOL 500 MG PO TABS: 1000.0000 mg | ORAL_TABLET | Freq: Four times a day (QID) | ORAL | Status: DC | PRN

## 2013-06-17 MED ORDER — ONDANSETRON 4 MG PO TBDP
8.0000 mg | ORAL_TABLET | Freq: Once | ORAL | Status: AC
  Administered 2013-06-17: 8 mg via ORAL
  Filled 2013-06-17: qty 2

## 2013-06-17 MED ORDER — KETOROLAC TROMETHAMINE 60 MG/2ML IM SOLN
60.0000 mg | Freq: Once | INTRAMUSCULAR | Status: AC
  Administered 2013-06-17: 60 mg via INTRAMUSCULAR
  Filled 2013-06-17: qty 2

## 2013-06-17 MED ORDER — OXYCODONE-ACETAMINOPHEN 5-325 MG PO TABS: ORAL_TABLET | ORAL | Status: DC

## 2013-06-17 MED ORDER — NAPROXEN 250 MG PO TABS: 250.0000 mg | ORAL_TABLET | Freq: Two times a day (BID) | ORAL | Status: DC

## 2013-06-17 MED ORDER — HYDROMORPHONE HCL PF 2 MG/ML IJ SOLN
2.0000 mg | Freq: Once | INTRAMUSCULAR | Status: AC
  Administered 2013-06-17: 2 mg via INTRAMUSCULAR
  Filled 2013-06-17: qty 1
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
## 2013-06-17 NOTE — ED Notes (Addendum)
Pt's neurological assessment is intact. Pt is able to move all extremities but right leg and foot is more difficult due to extreme pain. Pt state 10/10 pain at rest.Pt has been feeling pain for several days, but became worse yesterday, until today he could not take the pain anymore.  Pt denies falls. Pt states a friend who teaches yoga tried to give him exercises for sciatica, but his pain has been so bad that he has been unable to complete them. Pt states he is nauseated, but it appears to be related to his pain. Pt states that he hates pills and would prefer IV medication.

## 2013-06-17 NOTE — ED Notes (Signed)
C/o severe lower back pain increasingly worse over past week. States he was unable to go to work due to pain. Thinks he saw blood in his urine 2 days ago. C/o nausea and vomiting this am

## 2013-06-19 LAB — URINE CULTURE: Colony Count: >=100000

## 2013-06-20 ENCOUNTER — Emergency Department (HOSPITAL_BASED_OUTPATIENT_CLINIC_OR_DEPARTMENT_OTHER): Payer: Self-pay

## 2013-06-20 ENCOUNTER — Encounter (HOSPITAL_BASED_OUTPATIENT_CLINIC_OR_DEPARTMENT_OTHER): Payer: Self-pay | Admitting: *Deleted

## 2013-06-20 ENCOUNTER — Emergency Department (HOSPITAL_BASED_OUTPATIENT_CLINIC_OR_DEPARTMENT_OTHER)
Admission: EM | Admit: 2013-06-20 | Discharge: 2013-06-20 | Disposition: A | Discharge status: Home / Self Care | Payer: Self-pay | Attending: Emergency Medicine | Admitting: Emergency Medicine

## 2013-06-20 DIAGNOSIS — Z8719 Personal history of other diseases of the digestive system: Secondary | ICD-10-CM | POA: Insufficient documentation

## 2013-06-20 DIAGNOSIS — IMO0002 Reserved for concepts with insufficient information to code with codable children: Secondary | ICD-10-CM | POA: Insufficient documentation

## 2013-06-20 DIAGNOSIS — I10 Essential (primary) hypertension: Secondary | ICD-10-CM | POA: Insufficient documentation

## 2013-06-20 DIAGNOSIS — Z79899 Other long term (current) drug therapy: Secondary | ICD-10-CM | POA: Insufficient documentation

## 2013-06-20 DIAGNOSIS — R110 Nausea: Secondary | ICD-10-CM | POA: Insufficient documentation

## 2013-06-20 DIAGNOSIS — G8929 Other chronic pain: Secondary | ICD-10-CM | POA: Insufficient documentation

## 2013-06-20 DIAGNOSIS — M5410 Radiculopathy, site unspecified: Secondary | ICD-10-CM

## 2013-06-20 DIAGNOSIS — Z87828 Personal history of other (healed) physical injury and trauma: Secondary | ICD-10-CM | POA: Insufficient documentation

## 2013-06-20 DIAGNOSIS — Z87891 Personal history of nicotine dependence: Secondary | ICD-10-CM | POA: Insufficient documentation

## 2013-06-20 LAB — CBC WITH DIFFERENTIAL/PLATELET
Basophils Absolute: 0 10*3/uL (ref 0.0–0.1)
Basophils Relative: 0 % (ref 0–1)
Eosinophils Absolute: 0 10*3/uL (ref 0.0–0.7)
Hemoglobin: 13.7 g/dL (ref 13.0–17.0)
MCH: 29.3 pg (ref 26.0–34.0)
MCHC: 33.7 g/dL (ref 30.0–36.0)
Monocytes Relative: 7 % (ref 3–12)
Neutro Abs: 6.3 10*3/uL (ref 1.7–7.7)
Neutrophils Relative %: 73 % (ref 43–77)
Platelets: 236 10*3/uL (ref 150–400)
RDW: 11.3 % — ABNORMAL LOW (ref 11.5–15.5)

## 2013-06-20 LAB — BASIC METABOLIC PANEL
BUN: 8 mg/dL (ref 6–23)
Chloride: 101 mEq/L (ref 96–112)
GFR calc Af Amer: >90 mL/min (ref >90)
GFR calc non Af Amer: >90 mL/min (ref >90)
Potassium: 3.4 mEq/L — ABNORMAL LOW (ref 3.5–5.1)
Sodium: 136 mEq/L (ref 135–145)

## 2013-06-20 MED ORDER — IBUPROFEN 800 MG PO TABS: 800.0000 mg | ORAL_TABLET | Freq: Three times a day (TID) | ORAL | Status: DC

## 2013-06-20 MED ORDER — METOCLOPRAMIDE HCL 10 MG PO TABS: 10.0000 mg | ORAL_TABLET | Freq: Four times a day (QID) | ORAL | Status: DC | PRN

## 2013-06-20 MED ORDER — HYDROMORPHONE HCL PF 1 MG/ML IJ SOLN: 1.0000 mg | Freq: Once | INTRAMUSCULAR | Status: DC

## 2013-06-20 MED ORDER — HYDROMORPHONE HCL PF 1 MG/ML IJ SOLN
1.0000 mg | Freq: Once | INTRAMUSCULAR | Status: AC
Start: 2013-06-20 — End: 2013-06-20
  Administered 2013-06-20: 1 mg via INTRAVENOUS
  Filled 2013-06-20: qty 1

## 2013-06-20 MED ORDER — ONDANSETRON HCL 4 MG/2ML IJ SOLN
4.0000 mg | Freq: Once | INTRAMUSCULAR | Status: AC
  Administered 2013-06-20: 4 mg via INTRAVENOUS
  Filled 2013-06-20: qty 2

## 2013-06-20 MED ORDER — DEXAMETHASONE SODIUM PHOSPHATE 4 MG/ML IJ SOLN
4.0000 mg | Freq: Once | INTRAMUSCULAR | Status: AC
  Administered 2013-06-20: 4 mg via INTRAVENOUS
  Filled 2013-06-20: qty 1

## 2013-06-20 MED ORDER — DIAZEPAM 5 MG/ML IJ SOLN
5.0000 mg | Freq: Once | INTRAMUSCULAR | Status: AC
  Administered 2013-06-20: 5 mg via INTRAVENOUS
  Filled 2013-06-20: qty 2

## 2013-06-20 MED ORDER — PREDNISONE 20 MG PO TABS: ORAL_TABLET | ORAL | Status: DC

## 2013-06-20 MED ORDER — HYDROMORPHONE HCL PF 1 MG/ML IJ SOLN
1.0000 mg | Freq: Once | INTRAMUSCULAR | Status: AC
  Administered 2013-06-20: 1 mg via INTRAVENOUS
  Filled 2013-06-20: qty 1

## 2013-06-20 MED ORDER — KETOROLAC TROMETHAMINE 30 MG/ML IJ SOLN
30.0000 mg | Freq: Once | INTRAMUSCULAR | Status: AC
  Administered 2013-06-20: 30 mg via INTRAVENOUS
  Filled 2013-06-20: qty 1

## 2013-06-20 MED ORDER — DIAZEPAM 5 MG PO TABS: 5.0000 mg | ORAL_TABLET | Freq: Four times a day (QID) | ORAL | Status: DC | PRN

## 2013-06-20 MED ORDER — SODIUM CHLORIDE 0.9 % IV BOLUS (SEPSIS)
1000.0000 mL | Freq: Once | INTRAVENOUS | Status: AC
  Administered 2013-06-20: 1000 mL via INTRAVENOUS

## 2013-06-20 NOTE — ED Notes (Signed)
Patient states he has a one month history of back pain with shooting pain down his buttocks and legs.  States he was seen at [facility] ED and continues to have pain.

## 2013-06-20 NOTE — ED Notes (Signed)
Radiology at bedside making apt for outpatient mri with pt.

## 2013-06-20 NOTE — ED Notes (Signed)
Report received, pt care assumed. Pt states his pain is greatly decreased. Dr. Silverio Lay at bedside to update pt on plan of care.
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
## 2013-06-20 NOTE — ED Notes (Signed)
Pt assisted to call for ride home. Pt states he does not feel like he can sit in the lobby to await his ride.

## 2013-06-22 ENCOUNTER — Ambulatory Visit (HOSPITAL_BASED_OUTPATIENT_CLINIC_OR_DEPARTMENT_OTHER)
Admit: 2013-06-22 | Discharge: 2013-06-22 | Disposition: A | Discharge status: Home / Self Care | Payer: Self-pay | Attending: Emergency Medicine | Admitting: Emergency Medicine

## 2013-06-22 DIAGNOSIS — M5126 Other intervertebral disc displacement, lumbar region: Secondary | ICD-10-CM | POA: Insufficient documentation

## 2013-06-22 DIAGNOSIS — Q619 Cystic kidney disease, unspecified: Secondary | ICD-10-CM | POA: Insufficient documentation

## 2013-06-22 DIAGNOSIS — M51379 Other intervertebral disc degeneration, lumbosacral region without mention of lumbar back pain or lower extremity pain: Secondary | ICD-10-CM | POA: Insufficient documentation

## 2013-06-22 DIAGNOSIS — M5137 Other intervertebral disc degeneration, lumbosacral region: Secondary | ICD-10-CM | POA: Insufficient documentation

## 2014-03-13 ENCOUNTER — Emergency Department (HOSPITAL_BASED_OUTPATIENT_CLINIC_OR_DEPARTMENT_OTHER)
Admission: EM | Admit: 2014-03-13 | Discharge: 2014-03-13 | Disposition: A | Discharge status: Home / Self Care | Payer: Self-pay | Attending: Emergency Medicine | Admitting: Emergency Medicine

## 2014-03-13 ENCOUNTER — Encounter (HOSPITAL_BASED_OUTPATIENT_CLINIC_OR_DEPARTMENT_OTHER): Payer: Self-pay | Admitting: Emergency Medicine

## 2014-03-13 ENCOUNTER — Emergency Department (HOSPITAL_BASED_OUTPATIENT_CLINIC_OR_DEPARTMENT_OTHER): Payer: Self-pay

## 2014-03-13 DIAGNOSIS — K5289 Other specified noninfective gastroenteritis and colitis: Secondary | ICD-10-CM | POA: Insufficient documentation

## 2014-03-13 DIAGNOSIS — Z87891 Personal history of nicotine dependence: Secondary | ICD-10-CM | POA: Insufficient documentation

## 2014-03-13 DIAGNOSIS — G8929 Other chronic pain: Secondary | ICD-10-CM | POA: Insufficient documentation

## 2014-03-13 DIAGNOSIS — Z8719 Personal history of other diseases of the digestive system: Secondary | ICD-10-CM | POA: Insufficient documentation

## 2014-03-13 DIAGNOSIS — K529 Noninfective gastroenteritis and colitis, unspecified: Secondary | ICD-10-CM

## 2014-03-13 DIAGNOSIS — Z8669 Personal history of other diseases of the nervous system and sense organs: Secondary | ICD-10-CM | POA: Insufficient documentation

## 2014-03-13 DIAGNOSIS — I10 Essential (primary) hypertension: Secondary | ICD-10-CM | POA: Insufficient documentation

## 2014-03-13 LAB — COMPREHENSIVE METABOLIC PANEL
ALBUMIN: 4.2 g/dL (ref 3.5–5.2)
ALK PHOS: 53 U/L (ref 39–117)
ALT: 22 U/L (ref 0–53)
AST: 23 U/L (ref 0–37)
BILIRUBIN TOTAL: 0.4 mg/dL (ref 0.3–1.2)
BUN: 9 mg/dL (ref 6–23)
CHLORIDE: 101 meq/L (ref 96–112)
CO2: 23 mEq/L (ref 19–32)
Calcium: 9.1 mg/dL (ref 8.4–10.5)
Creatinine, Ser: 1 mg/dL (ref 0.50–1.35)
GFR calc Af Amer: >90 mL/min (ref >90)
GFR calc non Af Amer: >90 mL/min (ref >90)
Glucose, Bld: 102 mg/dL — ABNORMAL HIGH (ref 70–99)
POTASSIUM: 3.6 meq/L — AB (ref 3.7–5.3)
SODIUM: 138 meq/L (ref 137–147)
TOTAL PROTEIN: 7.7 g/dL (ref 6.0–8.3)

## 2014-03-13 LAB — URINE MICROSCOPIC-ADD ON

## 2014-03-13 LAB — URINALYSIS, ROUTINE W REFLEX MICROSCOPIC
Glucose, UA: NEGATIVE mg/dL
Hgb urine dipstick: NEGATIVE
NITRITE: NEGATIVE
PH: 6 (ref 5.0–8.0)
Protein, ur: 100 mg/dL — AB
UROBILINOGEN UA: 1 mg/dL (ref 0.0–1.0)

## 2014-03-13 LAB — LIPASE, BLOOD: LIPASE: 23 U/L (ref 11–59)

## 2014-03-13 LAB — CBC WITH DIFFERENTIAL/PLATELET
BASOS ABS: 0 10*3/uL (ref 0.0–0.1)
BASOS PCT: 0 % (ref 0–1)
EOS ABS: 0.1 10*3/uL (ref 0.0–0.7)
Eosinophils Relative: 1 % (ref 0–5)
HCT: 39.2 % (ref 39.0–52.0)
HEMOGLOBIN: 13.6 g/dL (ref 13.0–17.0)
Lymphocytes Relative: 20 % (ref 12–46)
Lymphs Abs: 1.6 10*3/uL (ref 0.7–4.0)
MCH: 30.5 pg (ref 26.0–34.0)
MCHC: 34.7 g/dL (ref 30.0–36.0)
MCV: 87.9 fL (ref 78.0–100.0)
Monocytes Absolute: 1.3 10*3/uL — ABNORMAL HIGH (ref 0.1–1.0)
Monocytes Relative: 16 % — ABNORMAL HIGH (ref 3–12)
NEUTROS ABS: 5.2 10*3/uL (ref 1.7–7.7)
NEUTROS PCT: 63 % (ref 43–77)
PLATELETS: 201 10*3/uL (ref 150–400)
RBC: 4.46 MIL/uL (ref 4.22–5.81)
RDW: 11.5 % (ref 11.5–15.5)
WBC: 8.2 10*3/uL (ref 4.0–10.5)

## 2014-03-13 MED ORDER — IOHEXOL 300 MG/ML  SOLN
100.0000 mL | Freq: Once | INTRAMUSCULAR | Status: AC | PRN
  Administered 2014-03-13: 100 mL via INTRAVENOUS

## 2014-03-13 MED ORDER — MORPHINE SULFATE 4 MG/ML IJ SOLN
INTRAMUSCULAR | Status: AC
  Administered 2014-03-13: 4 mg via INTRAVENOUS
  Filled 2014-03-13: qty 1

## 2014-03-13 MED ORDER — ONDANSETRON HCL 4 MG/2ML IJ SOLN
4.0000 mg | Freq: Once | INTRAMUSCULAR | Status: AC
  Administered 2014-03-13: 4 mg via INTRAVENOUS
  Filled 2014-03-13: qty 2

## 2014-03-13 MED ORDER — CIPROFLOXACIN HCL 500 MG PO TABS: 500.0000 mg | ORAL_TABLET | Freq: Two times a day (BID) | ORAL | Status: DC

## 2014-03-13 MED ORDER — MORPHINE SULFATE 4 MG/ML IJ SOLN
4.0000 mg | Freq: Once | INTRAMUSCULAR | Status: AC
  Administered 2014-03-13: 4 mg via INTRAVENOUS

## 2014-03-13 MED ORDER — IOHEXOL 300 MG/ML  SOLN
50.0000 mL | Freq: Once | INTRAMUSCULAR | Status: AC | PRN
  Administered 2014-03-13: 50 mL via ORAL

## 2014-03-13 MED ORDER — OXYCODONE-ACETAMINOPHEN 5-325 MG PO TABS: 2.0000 | ORAL_TABLET | ORAL | Status: DC | PRN

## 2014-03-13 MED ORDER — METRONIDAZOLE 500 MG PO TABS: 500.0000 mg | ORAL_TABLET | Freq: Three times a day (TID) | ORAL | Status: DC

## 2014-03-13 MED ORDER — MORPHINE SULFATE 4 MG/ML IJ SOLN
4.0000 mg | Freq: Once | INTRAMUSCULAR | Status: AC
  Administered 2014-03-13: 4 mg via INTRAVENOUS
  Filled 2014-03-13: qty 1

## 2014-03-13 MED ORDER — MORPHINE SULFATE 4 MG/ML IJ SOLN
INTRAMUSCULAR | Status: AC
  Filled 2014-03-13: qty 1

## 2014-03-13 MED ORDER — SODIUM CHLORIDE 0.9 % IV BOLUS (SEPSIS)
1000.0000 mL | Freq: Once | INTRAVENOUS | Status: AC
  Administered 2014-03-13: 1000 mL via INTRAVENOUS

## 2014-03-13 NOTE — ED Notes (Signed)
Patient preparing for discharge. 

## 2014-03-13 NOTE — ED Notes (Signed)
Pt having lower abdominal pain since Sunday.  Pt having N/V.  Pt unable to keep food down.  Pt states he is having dark red blood in stool x 2 days.

## 2014-03-13 NOTE — ED Notes (Signed)
GI has been repaged

## 2014-03-13 NOTE — Discharge Instructions (Signed)
Cipro and Flagyl as prescribed.  Percocet as needed for pain.  Call Dr. Regino Schultze office to arrange a followup appointment. Her contact information has been provided on this discharge summary. She is aware of your condition and wants you to have the next available appointment.   Colitis Colitis is inflammation of the colon. Colitis can be a short-term or long-standing (chronic) illness. Crohn's disease and ulcerative colitis are 2 types of colitis which are chronic. They usually require lifelong treatment. CAUSES  There are many different causes of colitis, including:  Viruses.  Germs (bacteria).  Medicine reactions. SYMPTOMS   Diarrhea.  Intestinal bleeding.  Pain.  Fever.  Throwing up (vomiting).  Tiredness (fatigue).  Weight loss.  Bowel blockage. DIAGNOSIS  The diagnosis of colitis is based on examination and stool or blood tests. X-rays, CT scan, and colonoscopy may also be needed. TREATMENT  Treatment may include:  Fluids given through the vein (intravenously).  Bowel rest (nothing to eat or drink for a period of time).  Medicine for pain and diarrhea.  Medicines (antibiotics) that kill germs.  Cortisone medicines.  Surgery. HOME CARE INSTRUCTIONS   Get plenty of rest.  Drink enough water and fluids to keep your urine clear or pale yellow.  Eat a well-balanced diet.  Call your caregiver for follow-up as recommended. SEEK IMMEDIATE MEDICAL CARE IF:   You develop chills.  You have an oral temperature above 102 F (38.9 C), not controlled by medicine.  You have extreme weakness, fainting, or dehydration.  You have repeated vomiting.  You develop severe belly (abdominal) pain or are passing bloody or tarry stools. MAKE SURE YOU:   Understand these instructions.  Will watch your condition.  Will get help right away if you are not doing well or get worse. Document Released: 11/03/2004 Document Revised: 12/19/2011 Document Reviewed:  01/29/2010 [facility] Patient Information 2014 [place], [place].
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
## 2014-03-14 ENCOUNTER — Encounter: Payer: Self-pay | Admitting: Internal Medicine

## 2014-03-14 ENCOUNTER — Encounter: Payer: Self-pay | Admitting: *Deleted

## 2014-03-21 ENCOUNTER — Encounter: Payer: Self-pay | Admitting: Internal Medicine

## 2014-05-09 ENCOUNTER — Ambulatory Visit: Payer: Self-pay | Admitting: Internal Medicine

## 2014-08-21 ENCOUNTER — Emergency Department (HOSPITAL_BASED_OUTPATIENT_CLINIC_OR_DEPARTMENT_OTHER)
Admission: EM | Admit: 2014-08-21 | Discharge: 2014-08-21 | Disposition: A | Discharge status: Home / Self Care | Payer: Self-pay | Attending: Emergency Medicine | Admitting: Emergency Medicine

## 2014-08-21 ENCOUNTER — Encounter (HOSPITAL_BASED_OUTPATIENT_CLINIC_OR_DEPARTMENT_OTHER): Payer: Self-pay | Admitting: *Deleted

## 2014-08-21 DIAGNOSIS — S39012A Strain of muscle, fascia and tendon of lower back, initial encounter: Secondary | ICD-10-CM | POA: Insufficient documentation

## 2014-08-21 DIAGNOSIS — I10 Essential (primary) hypertension: Secondary | ICD-10-CM | POA: Insufficient documentation

## 2014-08-21 DIAGNOSIS — Y990 Civilian activity done for income or pay: Secondary | ICD-10-CM | POA: Insufficient documentation

## 2014-08-21 DIAGNOSIS — Y9389 Activity, other specified: Secondary | ICD-10-CM | POA: Insufficient documentation

## 2014-08-21 DIAGNOSIS — Z87891 Personal history of nicotine dependence: Secondary | ICD-10-CM | POA: Insufficient documentation

## 2014-08-21 DIAGNOSIS — G43909 Migraine, unspecified, not intractable, without status migrainosus: Secondary | ICD-10-CM | POA: Insufficient documentation

## 2014-08-21 DIAGNOSIS — X58XXXA Exposure to other specified factors, initial encounter: Secondary | ICD-10-CM | POA: Insufficient documentation

## 2014-08-21 DIAGNOSIS — G8929 Other chronic pain: Secondary | ICD-10-CM | POA: Insufficient documentation

## 2014-08-21 DIAGNOSIS — Z792 Long term (current) use of antibiotics: Secondary | ICD-10-CM | POA: Insufficient documentation

## 2014-08-21 DIAGNOSIS — Z8619 Personal history of other infectious and parasitic diseases: Secondary | ICD-10-CM | POA: Insufficient documentation

## 2014-08-21 DIAGNOSIS — Y9289 Other specified places as the place of occurrence of the external cause: Secondary | ICD-10-CM | POA: Insufficient documentation

## 2014-08-21 DIAGNOSIS — Z8719 Personal history of other diseases of the digestive system: Secondary | ICD-10-CM | POA: Insufficient documentation

## 2014-08-21 MED ORDER — TRAMADOL HCL 50 MG PO TABS: 50.0000 mg | ORAL_TABLET | Freq: Four times a day (QID) | ORAL | Status: DC | PRN

## 2014-08-21 MED ORDER — ONDANSETRON 4 MG PO TBDP
ORAL_TABLET | ORAL | Status: AC
  Filled 2014-08-21: qty 1

## 2014-08-21 MED ORDER — ONDANSETRON 4 MG PO TBDP
4.0000 mg | ORAL_TABLET | Freq: Once | ORAL | Status: AC
  Administered 2014-08-21: 4 mg via ORAL

## 2014-08-21 MED ORDER — KETOROLAC TROMETHAMINE 60 MG/2ML IM SOLN
60.0000 mg | Freq: Once | INTRAMUSCULAR | Status: AC
  Administered 2014-08-21: 60 mg via INTRAMUSCULAR
  Filled 2014-08-21: qty 2

## 2014-08-21 MED ORDER — PREDNISONE 20 MG PO TABS: ORAL_TABLET | ORAL | Status: DC

## 2014-08-21 MED ORDER — ORPHENADRINE CITRATE ER 100 MG PO TB12: 100.0000 mg | ORAL_TABLET | Freq: Two times a day (BID) | ORAL | Status: DC

## 2014-08-21 MED ORDER — HYDROMORPHONE HCL 1 MG/ML IJ SOLN
1.0000 mg | Freq: Once | INTRAMUSCULAR | Status: AC
  Administered 2014-08-21: 1 mg via INTRAMUSCULAR
  Filled 2014-08-21: qty 1

## 2014-08-21 NOTE — ED Notes (Signed)
D/c home with ride - Rx x 3 given to pt at d/c

## 2014-08-21 NOTE — Discharge Instructions (Signed)
Back Pain, Adult °Low back pain is very common. About 1 in 5 people have back pain. The cause of low back pain is rarely dangerous. The pain often gets better over time. About half of people with a sudden onset of back pain feel better in just 2 weeks. About 8 in 10 people feel better by 6 weeks.  °CAUSES °Some common causes of back pain include: °· Strain of the muscles or ligaments supporting the spine. °· Wear and tear (degeneration) of the spinal discs. °· Arthritis. °· Direct injury to the back. °DIAGNOSIS °Most of the time, the direct cause of low back pain is not known. However, back pain can be treated effectively even when the exact cause of the pain is unknown. Answering your caregiver's questions about your overall health and symptoms is one of the most accurate ways to make sure the cause of your pain is not dangerous. If your caregiver needs more information, he or she may order lab work or imaging tests (X-rays or MRIs). However, even if imaging tests show changes in your back, this usually does not require surgery. °HOME CARE INSTRUCTIONS °For many people, back pain returns. Since low back pain is rarely dangerous, it is often a condition that people can learn to manage on their own.  °· Remain active. It is stressful on the back to sit or stand in one place. Do not sit, drive, or stand in one place for more than 30 minutes at a time. Take short walks on level surfaces as soon as pain allows. Try to increase the length of time you walk each day. °· Do not stay in bed. Resting more than 1 or 2 days can delay your recovery. °· Do not avoid exercise or work. Your body is made to move. It is not dangerous to be active, even though your back may hurt. Your back will likely heal faster if you return to being active before your pain is gone. °· Pay attention to your body when you  bend and lift. Many people have less discomfort when lifting if they bend their knees, keep the load close to their bodies, and  avoid twisting. Often, the most comfortable positions are those that put less stress on your recovering back. °· Find a comfortable position to sleep. Use a firm mattress and lie on your side with your knees slightly bent. If you lie on your back, put a pillow under your knees. °· Only take over-the-counter or prescription medicines as directed by your caregiver. Over-the-counter medicines to reduce pain and inflammation are often the most helpful. Your caregiver may prescribe muscle relaxant drugs. These medicines help dull your pain so you can more quickly return to your normal activities and healthy exercise. °· Put ice on the injured area. °¨ Put ice in a plastic bag. °¨ Place a towel between your skin and the bag. °¨ Leave the ice on for 15-20 minutes, 03-04 times a day for the first 2 to 3 days. After that, ice and heat may be alternated to reduce pain and spasms. °· Ask your caregiver about trying back exercises and gentle massage. This may be of some benefit. °· Avoid feeling anxious or stressed. Stress increases muscle tension and can worsen back pain. It is important to recognize when you are anxious or stressed and learn ways to manage it. Exercise is a great option. °SEEK MEDICAL CARE IF: °· You have pain that is not relieved with rest or medicine. °· You have pain that does not improve in 1 week. °· You have new symptoms. °· You are generally not feeling well. °SEEK   IMMEDIATE MEDICAL CARE IF:  °· You have pain that radiates from your back into your legs. °· You develop new bowel or bladder control problems. °· You have unusual weakness or numbness in your arms or legs. °· You develop nausea or vomiting. °· You develop abdominal pain. °· You feel faint. °Document Released: 09/26/2005 Document Revised: 03/27/2012 Document Reviewed: 01/28/2014 °ExitCare® Patient Information ©2015 [employer]. This information is not intended to replace advice given to you by your health care provider. Make sure you  discuss any questions you have with your health care provider. ° ° ° ° °Emergency Department Resource Guide °1) Find a Doctor and Pay Out of Pocket °Although you won't have to find out who is covered by your insurance plan, it is a good idea to ask around and get recommendations. You will then need to call the office and see if the doctor you have chosen will accept you as a new patient and what types of options they offer for patients who are self-pay. Some doctors offer discounts or will set up payment plans for their patients who do not have insurance, but you will need to ask so you aren't surprised when you get to your appointment. ° °2) Contact Your Local Health Department °Not all health departments have doctors that can see patients for sick visits, but many do, so it is worth a call to see if yours does. If you don't know where your local health department is, you can check in your phone book. The CDC also has a tool to help you locate your state's health department, and many state websites also have listings of all of their local health departments. ° °3) Find a Walk-in Clinic °If your illness is not likely to be very severe or complicated, you may want to try a walk in clinic. These are popping up all over the country in pharmacies, drugstores, and shopping centers. They're usually staffed by nurse practitioners or physician assistants that have been trained to treat common illnesses and complaints. They're usually fairly quick and inexpensive. However, if you have serious medical issues or chronic medical problems, these are probably not your best option. ° °No Primary Care Doctor: °- Call [facility] at  832-8000 - they can help you locate a primary care doctor that  accepts your insurance, provides certain services, etc. °- Physician Referral Service- 1-800-533-3463 ° °Chronic Pain Problems: °Organization         Address  Phone   Notes  °[facility]  (336) 297-2271 Patients need  to be referred by their primary care doctor.  ° °Medication Assistance: °Organization         Address  Phone   Notes  °[place] [facility] [address] °[place], [place] [address] (336) 641-8030 --Must be a resident of [place] °-- Must have NO insurance coverage whatsoever (no Medicaid/ Medicare, etc.) °-- The pt. MUST have a primary care doctor that directs their care regularly and follows them in the community °  °[facility]  (866) 331-1348   °[employer]  (888) 892-1162   ° °Agencies that provide inexpensive medical care: °Organization         Address  Phone   Notes  °[facility]  (336) 832-8035   °[facility]    (336) 832-7272   °[facility] [address] °[place], [place] [address] (336) 832-4777   °[facility] [address], °[place] (336) 271-4999   °[facility]    (336) 373-0678   °[facility]    (336) 272-1050   °[facility] ° [address], [place] Phone:  (336) 832-4444, Fax:  (336) 832-4440 Hours of Operation:  9 am - 6 pm, M-F.  Also accepts Medicaid/Medicare and self-pay.  °[facility] ° [address], [place] Phone: (336) 832-3150, Fax: (336) 832-3151. Hours of Operation:  8:30 am - 5:30 pm, M-F.  Also accepts Medicaid and self-pay.  °[facility] [place] [address], [place] Phone: (336) 878-6027   °[facility] [address], [place], [place] (336)723-1848, Ext. 123 Mondays & Thursdays: 7-9 AM.  First 15 patients are seen on a first come, first serve basis. °  ° °Medicaid-accepting [place] Providers: ° °Organization         Address  Phone   Notes  °[facility] [address], [place] (336) 641-2100 Also accepts self-pay patients.  °[facility] [address], [place] ° (336) 856-9996   °[facility] [address], [place] (336) 288-8857   °[facility] [address] [place] [address], [place] (336) 299-7000   °Veita Bland [address], [place]  ° (336) 373-1557 Only accepts [place] Access Medicaid patients after they have their name applied to their card.  ° °Self-Pay (no insurance) in [place]: ° °Organization         Address  Phone   Notes  °Sickle Cell Patients, [facility] [address], [place] (336) 832-1970   °[facility] [address], [place] (336) 832-4400   °[facility] ° [address], [place] (336) 992-4800   °[facility]/Dr. Osei-Bonsu ° [address] [place] [address], [place] or [address], [place] (336) 841-8500 Phone number for both [place] and [place] locations is the same.  °[facility] [address], [place] (336) 299-0000   °[facility] [address] [place] [address], [place] or [address] (336) 852-7530 °(336) 878-2260   °[facility] [address], [place] (336) 350-1642, phone; (336) 294-5005, fax Sees patients 1st and 3rd Saturday of every month.  Must not qualify for public or private insurance (i.e. Medicaid, Medicare, [place] Health Choice, Veterans' Benefits) • Household income should be no more than 200% of the poverty level •The clinic cannot treat you if you are pregnant or think you are pregnant • Sexually transmitted diseases are not treated at the clinic.  ° ° °Dental Care: °Organization         Address  Phone  Notes  °[facility] [address], [place] (336) 641-6152 Accepts children up to age 21 who are enrolled in Medicaid or [place] Health Choice; pregnant women with a Medicaid card; and children who have applied for Medicaid or [place] Health Choice, but were declined, whose  parents can pay a reduced fee at time of service.  °[facility] [place]  [address], [place] (336) 641-7733 Accepts children up to age 21 who are enrolled in Medicaid or [place] Health Choice; pregnant women with a Medicaid card; and children who have applied for Medicaid   or [place] Health Choice, but were declined, whose parents can pay a reduced fee at time of service.  °[facility] ° [address], [place] (336) 641-4533 Patients are seen by appointment only. Walk-ins are not accepted. [facility] will see patients 18 years of age and older. °Monday - Tuesday (8am-5pm) °Most Wednesdays (8:30-5pm) °$30 per visit, cash only  °[facility] ° [address], [place] (336) 641-4533 Patients are seen by appointment only. Walk-ins are not accepted. [facility] will see patients 18 years of age and older. °One Wednesday Evening (Monthly: Volunteer Based).  $30 per visit, cash only  °[facility]  (919) 537-3737 for adults; Children under age 4, call Graduate Pediatric Dentistry at (919) [phone]. Children aged 4-14, please call (919) [phone] to request a pediatric application. ° Dental services are provided in all areas of dental care including fillings, crowns and bridges, complete and partial dentures, implants, gum treatment, root canals, and extractions. Preventive care is also provided. Treatment is provided to both adults and children. °Patients are selected via a lottery and there is often a waiting list. °  °[facility] [address], °[place] ° (336) 763-8833 [email or web address] °  °[facility] [address], [place], [place] (336)723-1848, Ext. 123 Second and Fourth Thursday of each month, opens at 6:30 AM; Clinic ends at 9 AM.  Patients are seen on a first-come first-served basis, and a limited number are seen during each clinic.  ° °[facility] °  [address], [place], [place] (336) 723-7904   Eligibility Requirements °You must have lived in [place], [place], or [place] counties for at least the last three months. °  You cannot be eligible for state or federal sponsored healthcare insurance, including Veterans Administration, Medicaid, or Medicare. °  You generally cannot be eligible for healthcare insurance through your employer.  °  How to apply: °Eligibility screenings are held every Tuesday and Wednesday afternoon from 1:00 pm until 4:00 pm. You do not need an appointment for the interview!  °[facility] [address], [place], [place] 336-631-2330   °[facility]  336-342-8273   °[facility]  336-703-3100   °[facility]  336-570-6415   ° °Behavioral Health Resources in the Community: °Intensive Outpatient Programs °Organization         Address  Phone  Notes  °[facility] [address], [place], [place] 336-878-6098   °[facility] [address], [place], [place] 336-832-9800   °[facility] [address], [place], [place] ° 336-882-2125   °[facility] [address],  °[place], [place] 1-800-853-5163 or 336-641-4981   °Substance Abuse Resources °Organization         Address  Phone  Notes  °[facility]  336-882-2125   °[facility]  336-784-9470   °[facility]  336-285-9073   °[facility]  336-845-3988   °Residential & Outpatient Substance Abuse Program  1-800-659-3381   °Psychological Services °Organization         Address  Phone  Notes  °[facility]  336- 832-9600   °[facility]  336- 378-7881   °[facility] [address], [place] 1-800-853-5163 or 336-641-4981   ° °Mobile Crisis Teams °Organization         Address  Phone    Notes  °[facility], Mobile Crisis Care Unit  1-877-626-1772     °Assertive °[facility] ° [address] [place], [place] 336-834-9664   °Sharon DeEsch [address] °[place] 336-554-5454   ° °Self-Help/Support Groups °Organization         Address  Phone             Notes  °[facility] - variety of support groups  336- 373-1402 Call for more information  °Narcotics Anonymous (NA), [facility] [address], °[place]  2 meetings at this location  ° °Residential Treatment Programs °Organization         Address  Phone  Notes  °[facility] [address],    °[place]  1-866-801-8205   °[facility] ° [address], [place], [place] 704-293-8524   °[facility] [address], [place] 336-845-3988 Admissions: 8am-3pm M-F  °[facility] [address]    °[place], [place] 336-841-1104   °[facility] [address], [place], [place] 336-379-7146   °[facility] [address]  °[place], [place] 336-285-9073   °[facility] - Intensive Outpatient [address], [place], [place] 336-852-3033   °[facility] [address]  °[place], [place] 1-877-615-2722 or 336-784-9470   °[facility] [address] [place], [place] 336-227-7417 Accepts Medicaid  °[facility] [address]  °[place] 1-800-659-3381 Substance Abuse/Addiction Treatment  ° °[place] Behavioral Health Resources °Organization         Address  Phone  Notes  °[facility]  (888) 581-9988   °Julie Brannon, PhD [address] [place], [place]   (336) 349-5553 or (336) 951-0000   °[facility]   [address] °[place], [place] (336) 349-4454   °[facility] [address], [place], [place] (336) 342-8316 Insurance/Medicaid/sponsorship through [facility]  °[facility] [address]                                     [place], [place] (336) 342-8316 Therapy/tele-psych/case  °[facility] [address]  ° [place],  (336) 349-2233    °Dr. Arfeen  (336) 349-4544   °[facility] [place]  [employer] [place] [facility] 1) [address], [place] °2) [address], [place] °3)  [address], [place] (336) 349-3220 °(336) 342-7768 ° °(336) 342-8140   °[place] [facility] (336) 342-1394 or (336) 342-3537 (After Hours)    ° ° ° ° °

## 2014-08-21 NOTE — ED Notes (Signed)
Pt has hx of herniated disc- tried to catch file cabinet at work today that was falling over- now has extreme pain
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
## 2014-08-21 NOTE — ED Notes (Signed)
Pt given crackers and gingerale- pt is calling for ride

## 2015-05-04 ENCOUNTER — Emergency Department (HOSPITAL_BASED_OUTPATIENT_CLINIC_OR_DEPARTMENT_OTHER): Payer: Self-pay

## 2015-05-04 ENCOUNTER — Encounter (HOSPITAL_BASED_OUTPATIENT_CLINIC_OR_DEPARTMENT_OTHER): Payer: Self-pay | Admitting: *Deleted

## 2015-05-04 ENCOUNTER — Emergency Department (HOSPITAL_BASED_OUTPATIENT_CLINIC_OR_DEPARTMENT_OTHER)
Admission: EM | Admit: 2015-05-04 | Discharge: 2015-05-04 | Disposition: A | Discharge status: Home / Self Care | Payer: Self-pay | Attending: Emergency Medicine | Admitting: Emergency Medicine

## 2015-05-04 DIAGNOSIS — Z87891 Personal history of nicotine dependence: Secondary | ICD-10-CM | POA: Insufficient documentation

## 2015-05-04 DIAGNOSIS — I10 Essential (primary) hypertension: Secondary | ICD-10-CM | POA: Insufficient documentation

## 2015-05-04 DIAGNOSIS — Z8719 Personal history of other diseases of the digestive system: Secondary | ICD-10-CM | POA: Insufficient documentation

## 2015-05-04 DIAGNOSIS — Z792 Long term (current) use of antibiotics: Secondary | ICD-10-CM | POA: Insufficient documentation

## 2015-05-04 DIAGNOSIS — Z79899 Other long term (current) drug therapy: Secondary | ICD-10-CM | POA: Insufficient documentation

## 2015-05-04 DIAGNOSIS — G43909 Migraine, unspecified, not intractable, without status migrainosus: Secondary | ICD-10-CM | POA: Insufficient documentation

## 2015-05-04 DIAGNOSIS — K6289 Other specified diseases of anus and rectum: Secondary | ICD-10-CM | POA: Insufficient documentation

## 2015-05-04 DIAGNOSIS — Z8619 Personal history of other infectious and parasitic diseases: Secondary | ICD-10-CM | POA: Insufficient documentation

## 2015-05-04 DIAGNOSIS — G8929 Other chronic pain: Secondary | ICD-10-CM | POA: Insufficient documentation

## 2015-05-04 DIAGNOSIS — Z87828 Personal history of other (healed) physical injury and trauma: Secondary | ICD-10-CM | POA: Insufficient documentation

## 2015-05-04 LAB — BASIC METABOLIC PANEL
ANION GAP: 10 (ref 5–15)
BUN: 14 mg/dL (ref 6–20)
CALCIUM: 8.8 mg/dL — AB (ref 8.9–10.3)
CHLORIDE: 105 mmol/L (ref 101–111)
CO2: 25 mmol/L (ref 22–32)
Creatinine, Ser: 0.98 mg/dL (ref 0.61–1.24)
GFR calc Af Amer: >60 mL/min (ref >60)
GFR calc non Af Amer: >60 mL/min (ref >60)
Glucose, Bld: 111 mg/dL — ABNORMAL HIGH (ref 65–99)
POTASSIUM: 3.5 mmol/L (ref 3.5–5.1)
SODIUM: 140 mmol/L (ref 135–145)

## 2015-05-04 LAB — CBC WITH DIFFERENTIAL/PLATELET
BASOS ABS: 0 10*3/uL (ref 0.0–0.1)
Basophils Relative: 0 % (ref 0–1)
EOS ABS: 0.1 10*3/uL (ref 0.0–0.7)
EOS PCT: 2 % (ref 0–5)
HCT: 37.6 % — ABNORMAL LOW (ref 39.0–52.0)
HEMOGLOBIN: 12.4 g/dL — AB (ref 13.0–17.0)
LYMPHS PCT: 25 % (ref 12–46)
Lymphs Abs: 1.7 10*3/uL (ref 0.7–4.0)
MCH: 29.2 pg (ref 26.0–34.0)
MCHC: 33 g/dL (ref 30.0–36.0)
MCV: 88.7 fL (ref 78.0–100.0)
MONOS PCT: 12 % (ref 3–12)
Monocytes Absolute: 0.8 10*3/uL (ref 0.1–1.0)
NEUTROS ABS: 4.1 10*3/uL (ref 1.7–7.7)
Neutrophils Relative %: 61 % (ref 43–77)
PLATELETS: 203 10*3/uL (ref 150–400)
RBC: 4.24 MIL/uL (ref 4.22–5.81)
RDW: 11.6 % (ref 11.5–15.5)
WBC: 6.8 10*3/uL (ref 4.0–10.5)

## 2015-05-04 MED ORDER — LIDOCAINE VISCOUS 2 % MT SOLN: OROMUCOSAL | Status: DC

## 2015-05-04 MED ORDER — HYDROCODONE-ACETAMINOPHEN 5-325 MG PO TABS: 2.0000 | ORAL_TABLET | Freq: Once | ORAL | Status: DC

## 2015-05-04 MED ORDER — MORPHINE SULFATE 4 MG/ML IJ SOLN
4.0000 mg | Freq: Once | INTRAMUSCULAR | Status: AC
  Administered 2015-05-04: 4 mg via INTRAVENOUS

## 2015-05-04 MED ORDER — DOCUSATE SODIUM 100 MG PO CAPS: 100.0000 mg | ORAL_CAPSULE | Freq: Two times a day (BID) | ORAL | Status: DC

## 2015-05-04 MED ORDER — MORPHINE SULFATE 4 MG/ML IJ SOLN
INTRAMUSCULAR | Status: AC
  Administered 2015-05-04: 4 mg via INTRAVENOUS
  Filled 2015-05-04: qty 1

## 2015-05-04 MED ORDER — MORPHINE SULFATE 4 MG/ML IJ SOLN
6.0000 mg | Freq: Once | INTRAMUSCULAR | Status: AC
  Administered 2015-05-04: 6 mg via INTRAVENOUS
  Filled 2015-05-04: qty 2

## 2015-05-04 MED ORDER — IOHEXOL 300 MG/ML  SOLN
100.0000 mL | Freq: Once | INTRAMUSCULAR | Status: AC | PRN
  Administered 2015-05-04: 100 mL via INTRAVENOUS

## 2015-05-04 MED ORDER — HYDROCODONE-ACETAMINOPHEN 5-325 MG PO TABS
2.0000 | ORAL_TABLET | Freq: Once | ORAL | Status: AC
  Administered 2015-05-04: 2 via ORAL
  Filled 2015-05-04: qty 2

## 2015-05-04 MED ORDER — HYDROCODONE-ACETAMINOPHEN 5-325 MG PO TABS: 2.0000 | ORAL_TABLET | Freq: Four times a day (QID) | ORAL | Status: DC | PRN

## 2015-05-04 MED ORDER — ONDANSETRON HCL 4 MG/2ML IJ SOLN
INTRAMUSCULAR | Status: AC
  Administered 2015-05-04: 09:00:00 4 mg via INTRAVENOUS
  Filled 2015-05-04: qty 2

## 2015-05-04 MED ORDER — ONDANSETRON HCL 4 MG/2ML IJ SOLN
4.0000 mg | Freq: Once | INTRAMUSCULAR | Status: AC
  Administered 2015-05-04: 4 mg via INTRAVENOUS

## 2015-05-04 NOTE — ED Notes (Signed)
MD at bedside. 

## 2015-05-04 NOTE — Discharge Instructions (Signed)
Take the medications as prescribed. Use Senokot S as a laxitive, as directed to help you with bowel movements. Call Dr. Artelia Laroche schedule an appointment if not improved in a week. Make sure you sit in warm bathtub 4 times daily. For 30 minutes at a time. This will help with discomfort. Call the [facility] and community wellness center to get a primary care physician. You can try hydrocortisone cream 1% as directed on your rash. See the [facility] and community wellness Center if rash not improved in a week. Return if your condition worsens for any reason

## 2015-05-04 NOTE — ED Notes (Signed)
Pt awaiting ride due to narcotics being administered.

## 2015-05-04 NOTE — ED Notes (Signed)
Patient transported to CT 

## 2015-05-04 NOTE — ED Notes (Signed)
Pt's ride has arrived. Pt d/c'ed home.
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
## 2015-05-04 NOTE — ED Notes (Signed)
Pt sts he was working out 2 weeks ago and began experiencing pain in his rectum. He sts he has had episodes of dark red blood that changed over to bright red blood coming from his rectum. Pt sts it feels like something is stuck on the side of his rectum. Pt sts he has "some type of hernia" there. Pt also c/o a rash to his neck and left shoulder x3 days.

## 2015-05-04 NOTE — ED Notes (Signed)
hemocult card at the bedside.

## 2015-10-19 ENCOUNTER — Emergency Department (HOSPITAL_BASED_OUTPATIENT_CLINIC_OR_DEPARTMENT_OTHER): Payer: Self-pay

## 2015-10-19 ENCOUNTER — Encounter (HOSPITAL_BASED_OUTPATIENT_CLINIC_OR_DEPARTMENT_OTHER): Payer: Self-pay | Admitting: Emergency Medicine

## 2015-10-19 ENCOUNTER — Emergency Department (HOSPITAL_BASED_OUTPATIENT_CLINIC_OR_DEPARTMENT_OTHER)
Admission: EM | Admit: 2015-10-19 | Discharge: 2015-10-19 | Disposition: A | Discharge status: Home / Self Care | Payer: Self-pay | Attending: Emergency Medicine | Admitting: Emergency Medicine

## 2015-10-19 DIAGNOSIS — G8929 Other chronic pain: Secondary | ICD-10-CM | POA: Insufficient documentation

## 2015-10-19 DIAGNOSIS — Z9049 Acquired absence of other specified parts of digestive tract: Secondary | ICD-10-CM | POA: Insufficient documentation

## 2015-10-19 DIAGNOSIS — Z79899 Other long term (current) drug therapy: Secondary | ICD-10-CM | POA: Insufficient documentation

## 2015-10-19 DIAGNOSIS — Z87891 Personal history of nicotine dependence: Secondary | ICD-10-CM | POA: Insufficient documentation

## 2015-10-19 DIAGNOSIS — Z8619 Personal history of other infectious and parasitic diseases: Secondary | ICD-10-CM | POA: Insufficient documentation

## 2015-10-19 DIAGNOSIS — K6289 Other specified diseases of anus and rectum: Secondary | ICD-10-CM | POA: Insufficient documentation

## 2015-10-19 DIAGNOSIS — G43909 Migraine, unspecified, not intractable, without status migrainosus: Secondary | ICD-10-CM | POA: Insufficient documentation

## 2015-10-19 DIAGNOSIS — Z87828 Personal history of other (healed) physical injury and trauma: Secondary | ICD-10-CM | POA: Insufficient documentation

## 2015-10-19 DIAGNOSIS — I10 Essential (primary) hypertension: Secondary | ICD-10-CM | POA: Insufficient documentation

## 2015-10-19 LAB — CBC WITH DIFFERENTIAL/PLATELET
Basophils Absolute: 0 10*3/uL (ref 0.0–0.1)
Basophils Relative: 0 %
Eosinophils Absolute: 0.2 10*3/uL (ref 0.0–0.7)
Eosinophils Relative: 1 %
HCT: 39.7 % (ref 39.0–52.0)
HEMOGLOBIN: 12.8 g/dL — AB (ref 13.0–17.0)
LYMPHS PCT: 22 %
Lymphs Abs: 2.4 10*3/uL (ref 0.7–4.0)
MCH: 28.7 pg (ref 26.0–34.0)
MCHC: 32.2 g/dL (ref 30.0–36.0)
MCV: 89 fL (ref 78.0–100.0)
Monocytes Absolute: 0.9 10*3/uL (ref 0.1–1.0)
Monocytes Relative: 8 %
NEUTROS PCT: 69 %
Neutro Abs: 7.5 10*3/uL (ref 1.7–7.7)
Platelets: 222 10*3/uL (ref 150–400)
RBC: 4.46 MIL/uL (ref 4.22–5.81)
RDW: 11.5 % (ref 11.5–15.5)
WBC: 10.9 10*3/uL — AB (ref 4.0–10.5)

## 2015-10-19 LAB — BASIC METABOLIC PANEL
ANION GAP: 4 — AB (ref 5–15)
BUN: 15 mg/dL (ref 6–20)
CHLORIDE: 105 mmol/L (ref 101–111)
CO2: 29 mmol/L (ref 22–32)
Calcium: 8.8 mg/dL — ABNORMAL LOW (ref 8.9–10.3)
Creatinine, Ser: 1.2 mg/dL (ref 0.61–1.24)
GFR calc non Af Amer: >60 mL/min (ref >60)
Glucose, Bld: 99 mg/dL (ref 65–99)
Potassium: 3.6 mmol/L (ref 3.5–5.1)
SODIUM: 138 mmol/L (ref 135–145)

## 2015-10-19 MED ORDER — IOHEXOL 300 MG/ML  SOLN
100.0000 mL | Freq: Once | INTRAMUSCULAR | Status: AC | PRN
  Administered 2015-10-19: 100 mL via INTRAVENOUS

## 2015-10-19 MED ORDER — OXYCODONE-ACETAMINOPHEN 5-325 MG PO TABS: 1.0000 | ORAL_TABLET | Freq: Four times a day (QID) | ORAL | Status: DC | PRN

## 2015-10-19 MED ORDER — MORPHINE SULFATE (PF) 4 MG/ML IV SOLN
4.0000 mg | Freq: Once | INTRAVENOUS | Status: AC
  Administered 2015-10-19: 4 mg via INTRAVENOUS
  Filled 2015-10-19: qty 1

## 2015-10-19 MED ORDER — HYDROCORTISONE 2.5 % RE CREA: TOPICAL_CREAM | RECTAL | Status: DC

## 2015-10-19 NOTE — ED Notes (Signed)
Pt noticed hard bubble like projetion from rectum on Friday, pain  Has increased dramatically  Since Friday, today patient was able to get out of house.

## 2015-10-19 NOTE — Discharge Instructions (Signed)
Anusol as prescribed.  Percocet as prescribed as needed for pain.  Metamucil : Take 1 heaping teaspoon in a glass of water 3 times daily. Colace (equate stool softener) : 100 mg twice daily.  Follow-up with your gastroenterologist if your symptoms do not improve in the next 2-3 days.   Proctitis Proctitis is swelling and soreness (inflammation) of the lining of the rectum. The rectum is at the end of the large intestine, and it leads to the anus. The inflammation causes pain and discomfort. It may be a short-term (acute) or long-lasting (chronic) problem. CAUSES This condition may be caused by:  STDs (sexually transmitted diseases).  Infection.  Trauma or injury to the anus or rectum.  Ulcerative colitis or Crohn disease.  Radiation therapy that is directed near the rectum.  Antibiotic therapy. SYMPTOMS Symptoms of this condition include:  Sudden, uncomfortable, and frequent urge to have a bowel movement.  Anal pain or rectal pain.  Pain or cramping in the abdomen.  Sensation that the rectum is full.  Rectal bleeding.  Pus or mucus discharge from the anus.  Diarrhea or frequent soft, loose stools.  Constipation.  Pain with bowel movements. DIAGNOSIS This condition may be diagnosed based on:  A medical history and physical exam.  Various tests, such as:  An STD test.  Blood tests.  Stool tests.  Rectal culture.  A procedure to evaluate the anal canal (anoscopy).  Procedures to look at the entire large bowel or part of it (colonoscopy or sigmoidoscopy). TREATMENT Treatment for this condition depends on the cause. The main goals of treatment are to reduce the symptoms of inflammation and to get rid of any infection. Treatment may include:  Home remedies and lifestyle changes, such as sitz baths and avoiding food right before bedtime.  Medicines, such as:  Topical ointments, foams, suppositories, or enemas, such as corticosteroids or  anti-inflammatories.  Antibiotic or antiviral medicines to treat infection or to control harmful bacteria.  Medicines to control diarrhea, soften stools, and reduce pain.  Medicines to suppress the immune system.  Nutritional, dietary, or herbal supplements.  Avoiding the activity that caused rectal trauma.  Heat or laser therapy for persistent bleeding.  A dilation procedure to enlarge a narrowed rectum.  Surgery to repair damaged rectal lining. This is rare. If your proctitis was caused by an STD, your health care provider may test you for infection again 3 months after treatment. HOME CARE INSTRUCTIONS  Take over-the-counter and prescription medicines only as told by your health care provider.  If you were prescribed an antibiotic medicine, take it as told by your health care provider. Do not stop taking the antibiotic even if you start to feel better.  Try to avoid eating right before bedtime.  Take sitz baths as told by your health care provider.  Keep all follow-up visits as told by your health care provider. This is important. SEEK MEDICAL CARE IF:  Your symptoms do not improve with treatment.  Your symptoms get worse.  You have a fever.   This information is not intended to replace advice given to you by your health care provider. Make sure you discuss any questions you have with your health care provider.   Document Released: 09/15/2011 Document Revised: 06/17/2015 Document Reviewed: 12/22/2014 Elsevier Interactive Patient Education [employer]2016 [employer]vier Inc.

## 2015-10-19 NOTE — ED Notes (Signed)
Pt returned from CT °
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
## 2015-10-19 NOTE — ED Notes (Signed)
Dr. Judd Lienelo aware of pt's continued pain despite medication

## 2015-10-19 NOTE — ED Notes (Signed)
Patient transported to CT 

## 2015-12-20 ENCOUNTER — Emergency Department (HOSPITAL_COMMUNITY)
Admission: EM | Admit: 2015-12-20 | Discharge: 2015-12-20 | Disposition: A | Discharge status: Home / Self Care | Payer: Self-pay | Attending: Emergency Medicine | Admitting: Emergency Medicine

## 2015-12-20 ENCOUNTER — Emergency Department (HOSPITAL_COMMUNITY): Payer: Self-pay

## 2015-12-20 ENCOUNTER — Encounter (HOSPITAL_COMMUNITY): Payer: Self-pay | Admitting: Family Medicine

## 2015-12-20 DIAGNOSIS — Z8719 Personal history of other diseases of the digestive system: Secondary | ICD-10-CM | POA: Insufficient documentation

## 2015-12-20 DIAGNOSIS — Z8619 Personal history of other infectious and parasitic diseases: Secondary | ICD-10-CM | POA: Insufficient documentation

## 2015-12-20 DIAGNOSIS — M549 Dorsalgia, unspecified: Secondary | ICD-10-CM

## 2015-12-20 DIAGNOSIS — M545 Low back pain: Secondary | ICD-10-CM | POA: Insufficient documentation

## 2015-12-20 DIAGNOSIS — Z79899 Other long term (current) drug therapy: Secondary | ICD-10-CM | POA: Insufficient documentation

## 2015-12-20 DIAGNOSIS — Z792 Long term (current) use of antibiotics: Secondary | ICD-10-CM | POA: Insufficient documentation

## 2015-12-20 DIAGNOSIS — Z87891 Personal history of nicotine dependence: Secondary | ICD-10-CM | POA: Insufficient documentation

## 2015-12-20 DIAGNOSIS — Z87828 Personal history of other (healed) physical injury and trauma: Secondary | ICD-10-CM | POA: Insufficient documentation

## 2015-12-20 DIAGNOSIS — G8929 Other chronic pain: Secondary | ICD-10-CM | POA: Insufficient documentation

## 2015-12-20 DIAGNOSIS — G43909 Migraine, unspecified, not intractable, without status migrainosus: Secondary | ICD-10-CM | POA: Insufficient documentation

## 2015-12-20 DIAGNOSIS — I10 Essential (primary) hypertension: Secondary | ICD-10-CM | POA: Insufficient documentation

## 2015-12-20 HISTORY — DX: Sciatica, unspecified side: M54.30

## 2015-12-20 MED ORDER — KETOROLAC TROMETHAMINE 30 MG/ML IJ SOLN
60.0000 mg | Freq: Once | INTRAMUSCULAR | Status: AC
  Administered 2015-12-20: 60 mg via INTRAMUSCULAR
  Filled 2015-12-20: qty 2

## 2015-12-20 MED ORDER — METHOCARBAMOL 500 MG PO TABS
500.0000 mg | ORAL_TABLET | Freq: Once | ORAL | Status: AC
  Administered 2015-12-20: 500 mg via ORAL
  Filled 2015-12-20: qty 1

## 2015-12-20 MED ORDER — IBUPROFEN 800 MG PO TABS: 800.0000 mg | ORAL_TABLET | Freq: Three times a day (TID) | ORAL | Status: DC

## 2015-12-20 MED ORDER — METHOCARBAMOL 500 MG PO TABS: 500.0000 mg | ORAL_TABLET | Freq: Two times a day (BID) | ORAL | Status: DC

## 2015-12-20 MED ORDER — HYDROCODONE-ACETAMINOPHEN 5-325 MG PO TABS: 1.0000 | ORAL_TABLET | ORAL | Status: DC | PRN

## 2015-12-20 MED ORDER — OXYCODONE-ACETAMINOPHEN 5-325 MG PO TABS
2.0000 | ORAL_TABLET | Freq: Once | ORAL | Status: AC
  Administered 2015-12-20: 2 via ORAL
  Filled 2015-12-20: qty 2

## 2015-12-20 NOTE — Discharge Instructions (Signed)
1. Medications: ibuprofen, robaxin, pain medicine, usual home medications 2. Treatment: rest, drink plenty of fluids, heat 3. Follow Up: please followup with your primary doctor and with the spine specialist for persistent symptmos for discussion of your diagnoses and further evaluation after today's visit; if you do not have a primary care doctor use the phone number listed in your discharge paperwork to find one; please return to the ER for loss of control of your bowel or bladder, numbness, weakness, new or worsening symptoms   Back Pain, Adult Back pain is very common. The pain often gets better over time. The cause of back pain is usually not dangerous. Most people can learn to manage their back pain on their own.  HOME CARE  Watch your back pain for any changes. The following actions may help to lessen any pain you are feeling:  Stay active. Start with short walks on flat ground if you can. Try to walk farther each day.  Exercise regularly as told by your doctor. Exercise helps your back heal faster. It also helps avoid future injury by keeping your muscles strong and flexible.  Do not sit, drive, or stand in one place for more than 30 minutes.  Do not stay in bed. Resting more than 1-2 days can slow down your recovery.  Be careful when you bend or lift an object. Use good form when lifting:  Bend at your knees.  Keep the object close to your body.  Do not twist.  Sleep on a firm mattress. Lie on your side, and bend your knees. If you lie on your back, put a pillow under your knees.  Take medicines only as told by your doctor.  Put ice on the injured area.  Put ice in a plastic bag.  Place a towel between your skin and the bag.  Leave the ice on for 20 minutes, 2-3 times a day for the first 2-3 days. After that, you can switch between ice and heat packs.  Avoid feeling anxious or stressed. Find good ways to deal with stress, such as exercise.  Maintain a healthy weight.  Extra weight puts stress on your back. GET HELP IF:   You have pain that does not go away with rest or medicine.  You have worsening pain that goes down into your legs or buttocks.  You have pain that does not get better in one week.  You have pain at night.  You lose weight.  You have a fever or chills. GET HELP RIGHT AWAY IF:   You cannot control when you poop (bowel movement) or pee (urinate).  Your arms or legs feel weak.  Your arms or legs lose feeling (numbness).  You feel sick to your stomach (nauseous) or throw up (vomit).  You have belly (abdominal) pain.  You feel like you may pass out (faint).   This information is not intended to replace advice given to you by your health care provider. Make sure you discuss any questions you have with your health care provider.   Document Released: 03/14/2008 Document Revised: 10/17/2014 Document Reviewed: 01/28/2014 Elsevier Interactive Patient Education [employer]2016 [employer] Inc[employer]
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
## 2015-12-20 NOTE — ED Notes (Signed)
Patient is complaining of lower back pain that radiates down right leg. Has had a history of sciatica. Pt denies any recent injury. Reports pain started when he started to get out of the car yesterday.

## 2015-12-21 MED FILL — HYDROCODON-APAP 5-325: 5-325 | 2 days supply | Qty: 8 | Fill #0

## 2015-12-21 MED FILL — IBUPROFEN 800 MG TABLET: 800 | 7 days supply | Qty: 21 | Fill #0

## 2015-12-21 MED FILL — METHOCARBAMOL 500 MG TABLET: 500 | 10 days supply | Qty: 20 | Fill #0

## 2015-12-22 ENCOUNTER — Encounter (HOSPITAL_BASED_OUTPATIENT_CLINIC_OR_DEPARTMENT_OTHER): Payer: Self-pay | Admitting: *Deleted

## 2015-12-22 ENCOUNTER — Emergency Department (HOSPITAL_BASED_OUTPATIENT_CLINIC_OR_DEPARTMENT_OTHER)
Admission: EM | Admit: 2015-12-22 | Discharge: 2015-12-22 | Disposition: A | Discharge status: Home / Self Care | Payer: Self-pay | Attending: Emergency Medicine | Admitting: Emergency Medicine

## 2015-12-22 DIAGNOSIS — G8929 Other chronic pain: Secondary | ICD-10-CM | POA: Insufficient documentation

## 2015-12-22 DIAGNOSIS — Z791 Long term (current) use of non-steroidal anti-inflammatories (NSAID): Secondary | ICD-10-CM | POA: Insufficient documentation

## 2015-12-22 DIAGNOSIS — Z8719 Personal history of other diseases of the digestive system: Secondary | ICD-10-CM | POA: Insufficient documentation

## 2015-12-22 DIAGNOSIS — Z79899 Other long term (current) drug therapy: Secondary | ICD-10-CM | POA: Insufficient documentation

## 2015-12-22 DIAGNOSIS — Z792 Long term (current) use of antibiotics: Secondary | ICD-10-CM | POA: Insufficient documentation

## 2015-12-22 DIAGNOSIS — I10 Essential (primary) hypertension: Secondary | ICD-10-CM | POA: Insufficient documentation

## 2015-12-22 DIAGNOSIS — Z87828 Personal history of other (healed) physical injury and trauma: Secondary | ICD-10-CM | POA: Insufficient documentation

## 2015-12-22 DIAGNOSIS — Z7952 Long term (current) use of systemic steroids: Secondary | ICD-10-CM | POA: Insufficient documentation

## 2015-12-22 DIAGNOSIS — M5441 Lumbago with sciatica, right side: Secondary | ICD-10-CM | POA: Insufficient documentation

## 2015-12-22 DIAGNOSIS — Z87891 Personal history of nicotine dependence: Secondary | ICD-10-CM | POA: Insufficient documentation

## 2015-12-22 DIAGNOSIS — Z8619 Personal history of other infectious and parasitic diseases: Secondary | ICD-10-CM | POA: Insufficient documentation

## 2015-12-22 MED ORDER — DIAZEPAM 5 MG/ML IJ SOLN
5.0000 mg | Freq: Once | INTRAMUSCULAR | Status: AC
  Administered 2015-12-22: 5 mg via INTRAMUSCULAR
  Filled 2015-12-22: qty 2

## 2015-12-22 NOTE — ED Notes (Addendum)
Registration clerk & EMT both asked patient if he wanted a wheelchair to triage/treatment room, patient declines both offers.
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
## 2015-12-25 ENCOUNTER — Encounter (HOSPITAL_COMMUNITY): Payer: Self-pay | Admitting: *Deleted

## 2015-12-25 ENCOUNTER — Emergency Department (HOSPITAL_COMMUNITY)
Admission: EM | Admit: 2015-12-25 | Discharge: 2015-12-25 | Disposition: A | Discharge status: Home / Self Care | Payer: Self-pay | Attending: Emergency Medicine | Admitting: Emergency Medicine

## 2015-12-25 ENCOUNTER — Emergency Department (HOSPITAL_COMMUNITY): Payer: Self-pay

## 2015-12-25 DIAGNOSIS — M545 Low back pain, unspecified: Secondary | ICD-10-CM

## 2015-12-25 DIAGNOSIS — Z87891 Personal history of nicotine dependence: Secondary | ICD-10-CM | POA: Insufficient documentation

## 2015-12-25 DIAGNOSIS — Z79899 Other long term (current) drug therapy: Secondary | ICD-10-CM | POA: Insufficient documentation

## 2015-12-25 DIAGNOSIS — G8929 Other chronic pain: Secondary | ICD-10-CM | POA: Insufficient documentation

## 2015-12-25 DIAGNOSIS — Z87828 Personal history of other (healed) physical injury and trauma: Secondary | ICD-10-CM | POA: Insufficient documentation

## 2015-12-25 DIAGNOSIS — M5430 Sciatica, unspecified side: Secondary | ICD-10-CM | POA: Insufficient documentation

## 2015-12-25 DIAGNOSIS — I10 Essential (primary) hypertension: Secondary | ICD-10-CM | POA: Insufficient documentation

## 2015-12-25 DIAGNOSIS — Z791 Long term (current) use of non-steroidal anti-inflammatories (NSAID): Secondary | ICD-10-CM | POA: Insufficient documentation

## 2015-12-25 DIAGNOSIS — Z8719 Personal history of other diseases of the digestive system: Secondary | ICD-10-CM | POA: Insufficient documentation

## 2015-12-25 DIAGNOSIS — Z8619 Personal history of other infectious and parasitic diseases: Secondary | ICD-10-CM | POA: Insufficient documentation

## 2015-12-25 MED ORDER — NAPROXEN 500 MG PO TABS: 500.0000 mg | ORAL_TABLET | Freq: Two times a day (BID) | ORAL | Status: DC

## 2015-12-25 MED ORDER — HYDROMORPHONE HCL 1 MG/ML IJ SOLN
1.0000 mg | Freq: Once | INTRAMUSCULAR | Status: AC
  Administered 2015-12-25: 1 mg via INTRAVENOUS
  Filled 2015-12-25: qty 1

## 2015-12-25 MED ORDER — TRAMADOL HCL 50 MG PO TABS: 50.0000 mg | ORAL_TABLET | Freq: Four times a day (QID) | ORAL | Status: DC | PRN

## 2015-12-25 MED ORDER — CARISOPRODOL 350 MG PO TABS: 350.0000 mg | ORAL_TABLET | Freq: Three times a day (TID) | ORAL | Status: DC

## 2015-12-25 NOTE — ED Notes (Signed)
Patient transported to MRI 

## 2015-12-25 NOTE — ED Notes (Signed)
Bed: WA08 Expected date:  Expected time:  Means of arrival:  Comments: EMS/60M/back pain
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
## 2015-12-25 NOTE — ED Notes (Signed)
Per EMS, pt from home, pt reports hx of back pain.  Pt was laying on the floor upon arrival to the home.  Pt was diaphoretic.  22G R hand.  Received fentanyl  IVP.  Pt denies any injury at this time.  Pt reports overtime it's been bothering him.  Unable to sit up straight.

## 2015-12-25 NOTE — Discharge Instructions (Signed)

## 2015-12-25 NOTE — Progress Notes (Signed)
EDCM went to speak to patient at bedside, however patient was discharged.

## 2016-03-15 ENCOUNTER — Encounter (HOSPITAL_BASED_OUTPATIENT_CLINIC_OR_DEPARTMENT_OTHER): Payer: Self-pay | Admitting: Emergency Medicine

## 2016-03-15 ENCOUNTER — Emergency Department (HOSPITAL_BASED_OUTPATIENT_CLINIC_OR_DEPARTMENT_OTHER)
Admission: EM | Admit: 2016-03-15 | Discharge: 2016-03-15 | Disposition: A | Discharge status: Home / Self Care | Payer: Self-pay | Attending: Emergency Medicine | Admitting: Emergency Medicine

## 2016-03-15 DIAGNOSIS — R1032 Left lower quadrant pain: Secondary | ICD-10-CM | POA: Insufficient documentation

## 2016-03-15 DIAGNOSIS — Z79899 Other long term (current) drug therapy: Secondary | ICD-10-CM | POA: Insufficient documentation

## 2016-03-15 DIAGNOSIS — R197 Diarrhea, unspecified: Secondary | ICD-10-CM | POA: Insufficient documentation

## 2016-03-15 DIAGNOSIS — I10 Essential (primary) hypertension: Secondary | ICD-10-CM | POA: Insufficient documentation

## 2016-03-15 DIAGNOSIS — Z87891 Personal history of nicotine dependence: Secondary | ICD-10-CM | POA: Insufficient documentation

## 2016-03-15 NOTE — ED Notes (Signed)
Pt having diarrhea since yesterday.  Pt states he is drinking well.  Pt sent from work as it seems to be passed along from there.  No known fever.
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
## 2016-03-15 NOTE — Discharge Instructions (Signed)
Diarrhea  Diarrhea is watery poop (stool). It can make you feel weak, tired, thirsty, or give you a dry mouth (signs of dehydration). Watery poop is a sign of another problem, most often an infection. It often lasts 2-3 days. It can last longer if it is a sign of something serious. Take care of yourself as told by your doctor.  HOME CARE   · Drink 1 cup (8 ounces) of fluid each time you have watery poop.  · Do not drink the following fluids:    Those that contain simple sugars (fructose, glucose, galactose, lactose, sucrose, maltose).    Sports drinks.    Fruit juices.    Whole milk products.    Sodas.    Drinks with caffeine (coffee, tea, soda) or alcohol.  · Oral rehydration solution may be used if the doctor says it is okay. You may make your own solution. Follow this recipe:    ?-? teaspoon table salt.    ¾ teaspoon baking soda.    ? teaspoon salt substitute containing potassium chloride.    1 ? tablespoons sugar.    1 liter (34 ounces) of water.  · Avoid the following foods:    High fiber foods, such as raw fruits and vegetables.    Nuts, seeds, and whole grain breads and cereals.     Those that are sweetened with sugar alcohols (xylitol, sorbitol, mannitol).  · Try eating the following foods:    Starchy foods, such as rice, toast, pasta, low-sugar cereal, oatmeal, baked potatoes, crackers, and bagels.    Bananas.    Applesauce.  · Eat probiotic-rich foods, such as yogurt and milk products that are fermented.  · Wash your hands well after each time you have watery poop.  · Only take medicine as told by your doctor.  · Take a warm bath to help lessen burning or pain from having watery poop.  GET HELP RIGHT AWAY IF:   · You cannot drink fluids without throwing up (vomiting).  · You keep throwing up.  · You have blood in your poop, or your poop looks black and tarry.  · You do not pee (urinate) in 6-8 hours, or there is only a small amount of very dark pee.  · You have belly (abdominal) pain that gets worse or  stays in the same spot (localizes).  · You are weak, dizzy, confused, or light-headed.  · You have a very bad headache.  · Your watery poop gets worse or does not get better.  · You have a fever or lasting symptoms for more than 2-3 days.  · You have a fever and your symptoms suddenly get worse.  MAKE SURE YOU:   · Understand these instructions.  · Will watch your condition.  · Will get help right away if you are not doing well or get worse.     This information is not intended to replace advice given to you by your health care provider. Make sure you discuss any questions you have with your health care provider.     Document Released: 03/14/2008 Document Revised: 10/17/2014 Document Reviewed: 06/03/2012  Elsevier Interactive Patient Education ©2016 [employer]

## 2016-05-24 ENCOUNTER — Emergency Department (HOSPITAL_BASED_OUTPATIENT_CLINIC_OR_DEPARTMENT_OTHER): Payer: No Typology Code available for payment source

## 2016-05-24 ENCOUNTER — Emergency Department (HOSPITAL_BASED_OUTPATIENT_CLINIC_OR_DEPARTMENT_OTHER)
Admission: EM | Admit: 2016-05-24 | Discharge: 2016-05-24 | Disposition: A | Discharge status: Home / Self Care | Payer: No Typology Code available for payment source | Attending: Emergency Medicine | Admitting: Emergency Medicine

## 2016-05-24 ENCOUNTER — Encounter (HOSPITAL_BASED_OUTPATIENT_CLINIC_OR_DEPARTMENT_OTHER): Payer: Self-pay | Admitting: *Deleted

## 2016-05-24 DIAGNOSIS — M542 Cervicalgia: Secondary | ICD-10-CM | POA: Diagnosis present

## 2016-05-24 DIAGNOSIS — Y9389 Activity, other specified: Secondary | ICD-10-CM | POA: Insufficient documentation

## 2016-05-24 DIAGNOSIS — M791 Myalgia: Secondary | ICD-10-CM | POA: Diagnosis not present

## 2016-05-24 DIAGNOSIS — M436 Torticollis: Secondary | ICD-10-CM | POA: Diagnosis not present

## 2016-05-24 DIAGNOSIS — Y999 Unspecified external cause status: Secondary | ICD-10-CM | POA: Insufficient documentation

## 2016-05-24 DIAGNOSIS — Y92410 Unspecified street and highway as the place of occurrence of the external cause: Secondary | ICD-10-CM | POA: Insufficient documentation

## 2016-05-24 DIAGNOSIS — R51 Headache: Secondary | ICD-10-CM | POA: Diagnosis not present

## 2016-05-24 DIAGNOSIS — Z87891 Personal history of nicotine dependence: Secondary | ICD-10-CM | POA: Insufficient documentation

## 2016-05-24 DIAGNOSIS — I10 Essential (primary) hypertension: Secondary | ICD-10-CM | POA: Diagnosis not present

## 2016-05-24 MED ORDER — OXYCODONE HCL 5 MG PO TABS
5.0000 mg | ORAL_TABLET | Freq: Once | ORAL | Status: AC
  Administered 2016-05-24: 5 mg via ORAL
  Filled 2016-05-24: qty 1

## 2016-05-24 MED ORDER — ACETAMINOPHEN 500 MG PO TABS
1000.0000 mg | ORAL_TABLET | Freq: Once | ORAL | Status: AC
  Administered 2016-05-24: 1000 mg via ORAL
  Filled 2016-05-24: qty 2

## 2016-05-24 MED ORDER — IBUPROFEN 800 MG PO TABS
800.0000 mg | ORAL_TABLET | Freq: Once | ORAL | Status: AC
Start: 2016-05-24 — End: 2016-05-24
  Administered 2016-05-24: 800 mg via ORAL
  Filled 2016-05-24: qty 1

## 2016-05-24 MED ORDER — IOPAMIDOL (ISOVUE-370) INJECTION 76%
100.0000 mL | Freq: Once | INTRAVENOUS | Status: AC | PRN
  Administered 2016-05-24: 80 mL via INTRAVENOUS

## 2016-05-24 MED ORDER — DIAZEPAM 5 MG PO TABS
5.0000 mg | ORAL_TABLET | Freq: Once | ORAL | Status: AC
  Administered 2016-05-24: 5 mg via ORAL
  Filled 2016-05-24: qty 1

## 2016-05-24 MED ORDER — ONDANSETRON 4 MG PO TBDP
4.0000 mg | ORAL_TABLET | Freq: Once | ORAL | Status: AC
  Administered 2016-05-24: 4 mg via ORAL
  Filled 2016-05-24: qty 1
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
## 2016-11-29 ENCOUNTER — Emergency Department (HOSPITAL_BASED_OUTPATIENT_CLINIC_OR_DEPARTMENT_OTHER)
Admission: EM | Admit: 2016-11-29 | Discharge: 2016-11-29 | Disposition: A | Discharge status: Home / Self Care | Payer: Self-pay | Attending: Emergency Medicine | Admitting: Emergency Medicine

## 2016-11-29 ENCOUNTER — Encounter (HOSPITAL_BASED_OUTPATIENT_CLINIC_OR_DEPARTMENT_OTHER): Payer: Self-pay | Admitting: *Deleted

## 2016-11-29 DIAGNOSIS — Z79899 Other long term (current) drug therapy: Secondary | ICD-10-CM | POA: Insufficient documentation

## 2016-11-29 DIAGNOSIS — L723 Sebaceous cyst: Secondary | ICD-10-CM | POA: Insufficient documentation

## 2016-11-29 DIAGNOSIS — I10 Essential (primary) hypertension: Secondary | ICD-10-CM | POA: Insufficient documentation

## 2016-11-29 DIAGNOSIS — F17210 Nicotine dependence, cigarettes, uncomplicated: Secondary | ICD-10-CM | POA: Insufficient documentation

## 2016-11-29 DIAGNOSIS — L089 Local infection of the skin and subcutaneous tissue, unspecified: Secondary | ICD-10-CM

## 2016-11-29 MED ORDER — ONDANSETRON 4 MG PO TBDP
4.0000 mg | ORAL_TABLET | Freq: Once | ORAL | Status: AC
  Administered 2016-11-29: 4 mg via ORAL
  Filled 2016-11-29: qty 1

## 2016-11-29 MED ORDER — OXYCODONE-ACETAMINOPHEN 5-325 MG PO TABS
2.0000 | ORAL_TABLET | Freq: Once | ORAL | Status: AC
  Administered 2016-11-29: 2 via ORAL
  Filled 2016-11-29: qty 2

## 2016-11-29 MED ORDER — LIDOCAINE-EPINEPHRINE 1 %-1:100000 IJ SOLN
10.0000 mL | Freq: Once | INTRAMUSCULAR | Status: AC
Start: 2016-11-29 — End: 2016-11-29
  Administered 2016-11-29: 10 mL via INTRADERMAL
  Filled 2016-11-29: qty 1

## 2016-11-29 MED ORDER — CEPHALEXIN 500 MG PO CAPS: 500.0000 mg | ORAL_CAPSULE | Freq: Three times a day (TID) | ORAL | 0 refills | Status: AC

## 2016-11-29 MED ORDER — HYDROCODONE-ACETAMINOPHEN 5-325 MG PO TABS: 1.0000 | ORAL_TABLET | Freq: Four times a day (QID) | ORAL | 0 refills | Status: DC | PRN

## 2016-11-29 MED ORDER — SULFAMETHOXAZOLE-TRIMETHOPRIM 800-160 MG PO TABS: 1.0000 | ORAL_TABLET | Freq: Two times a day (BID) | ORAL | 0 refills | Status: AC

## 2016-11-29 MED FILL — HYDROCODON-APAP 5-325: 5-325 | 2 days supply | Qty: 10 | Fill #0

## 2016-11-29 MED FILL — SULFAMETHOXAZOLE/TMP DS TAB: 800-160 | 7 days supply | Qty: 14 | Fill #0

## 2016-11-29 MED FILL — CEPHALEXIN 500 MG CAPSULE: 500 | 7 days supply | Qty: 21 | Fill #0
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
## 2016-11-29 NOTE — ED Triage Notes (Signed)
C/o pain in swelling to right jaw area. C/o soreness  To chin, thinks it may be an in grown hair. Onset Yesterday.

## 2017-01-17 ENCOUNTER — Emergency Department (HOSPITAL_COMMUNITY): Payer: Self-pay

## 2017-01-17 ENCOUNTER — Emergency Department (HOSPITAL_COMMUNITY)
Admission: EM | Admit: 2017-01-17 | Discharge: 2017-01-17 | Disposition: A | Discharge status: Home / Self Care | Payer: Self-pay | Attending: Emergency Medicine | Admitting: Emergency Medicine

## 2017-01-17 ENCOUNTER — Encounter (HOSPITAL_COMMUNITY): Payer: Self-pay | Admitting: Emergency Medicine

## 2017-01-17 DIAGNOSIS — I10 Essential (primary) hypertension: Secondary | ICD-10-CM | POA: Insufficient documentation

## 2017-01-17 DIAGNOSIS — Y9222 Religious institution as the place of occurrence of the external cause: Secondary | ICD-10-CM | POA: Insufficient documentation

## 2017-01-17 DIAGNOSIS — Y999 Unspecified external cause status: Secondary | ICD-10-CM | POA: Insufficient documentation

## 2017-01-17 DIAGNOSIS — S93601A Unspecified sprain of right foot, initial encounter: Secondary | ICD-10-CM | POA: Insufficient documentation

## 2017-01-17 DIAGNOSIS — W109XXA Fall (on) (from) unspecified stairs and steps, initial encounter: Secondary | ICD-10-CM | POA: Insufficient documentation

## 2017-01-17 DIAGNOSIS — F17290 Nicotine dependence, other tobacco product, uncomplicated: Secondary | ICD-10-CM | POA: Insufficient documentation

## 2017-01-17 DIAGNOSIS — Y939 Activity, unspecified: Secondary | ICD-10-CM | POA: Insufficient documentation

## 2017-01-17 DIAGNOSIS — Z79899 Other long term (current) drug therapy: Secondary | ICD-10-CM | POA: Insufficient documentation

## 2017-01-17 MED ORDER — HYDROCODONE-ACETAMINOPHEN 5-325 MG PO TABS: 1.0000 | ORAL_TABLET | Freq: Four times a day (QID) | ORAL | 0 refills | Status: DC | PRN
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
## 2017-01-17 NOTE — Discharge Instructions (Signed)
Use ice on the sore area 3 or 4 times a day.  Elevate your right foot above the heart is much as possible to decrease the discomfort.  Use ibuprofen 400 mg 3 times a day with meals for pain  Do not drive or work when taking the narcotic pain medication.
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
## 2017-04-13 ENCOUNTER — Emergency Department (HOSPITAL_BASED_OUTPATIENT_CLINIC_OR_DEPARTMENT_OTHER)
Admission: EM | Admit: 2017-04-13 | Discharge: 2017-04-13 | Disposition: A | Discharge status: Home / Self Care | Payer: Self-pay | Attending: Emergency Medicine | Admitting: Emergency Medicine

## 2017-04-13 ENCOUNTER — Encounter (HOSPITAL_BASED_OUTPATIENT_CLINIC_OR_DEPARTMENT_OTHER): Payer: Self-pay | Admitting: *Deleted

## 2017-04-13 ENCOUNTER — Emergency Department (HOSPITAL_BASED_OUTPATIENT_CLINIC_OR_DEPARTMENT_OTHER): Payer: Self-pay

## 2017-04-13 DIAGNOSIS — G8929 Other chronic pain: Secondary | ICD-10-CM

## 2017-04-13 DIAGNOSIS — M5441 Lumbago with sciatica, right side: Secondary | ICD-10-CM | POA: Insufficient documentation

## 2017-04-13 DIAGNOSIS — M545 Low back pain, unspecified: Secondary | ICD-10-CM

## 2017-04-13 DIAGNOSIS — I10 Essential (primary) hypertension: Secondary | ICD-10-CM | POA: Insufficient documentation

## 2017-04-13 DIAGNOSIS — F17290 Nicotine dependence, other tobacco product, uncomplicated: Secondary | ICD-10-CM | POA: Insufficient documentation

## 2017-04-13 MED ORDER — KETOROLAC TROMETHAMINE 60 MG/2ML IM SOLN
60.0000 mg | Freq: Once | INTRAMUSCULAR | Status: AC
  Administered 2017-04-13: 60 mg via INTRAMUSCULAR
  Filled 2017-04-13: qty 2

## 2017-04-13 MED ORDER — PREDNISONE 50 MG PO TABS
60.0000 mg | ORAL_TABLET | Freq: Once | ORAL | Status: AC
  Administered 2017-04-13: 60 mg via ORAL
  Filled 2017-04-13: qty 1

## 2017-04-13 MED ORDER — METHOCARBAMOL 500 MG PO TABS: 500.0000 mg | ORAL_TABLET | Freq: Every evening | ORAL | 0 refills | Status: DC | PRN

## 2017-04-13 MED ORDER — PREDNISONE 10 MG PO TABS: 40.0000 mg | ORAL_TABLET | Freq: Every day | ORAL | 0 refills | Status: AC

## 2017-04-13 MED ORDER — METHOCARBAMOL 500 MG PO TABS
500.0000 mg | ORAL_TABLET | Freq: Once | ORAL | Status: AC
  Administered 2017-04-13: 500 mg via ORAL
  Filled 2017-04-13: qty 1

## 2017-04-13 NOTE — ED Triage Notes (Signed)
Pt reports flare-up of chronic low back pain that radiates down R leg x2 days. Denies genitourinary symptoms, n/v. Reports no pain meds PTA.

## 2017-04-13 NOTE — Discharge Instructions (Addendum)
The medicine prescribed can help with muscle spasm but cannot betaken if driving, with alcohol or operating machinery.  Start prednisone tomorrow for the next 4 days.  Follow up with Dr. Pearletha ForgeHudnall for your back pain and primary care.  Return if symptoms worsen, loss of bowel or bladder function, focal weakness or other concerning symptoms in the meantime.

## 2017-04-13 NOTE — ED Notes (Signed)
ED Provider at bedside. 

## 2017-04-13 NOTE — ED Notes (Signed)
MD at bedside. 
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
## 2017-10-10 HISTORY — PX: INCISION AND DRAINAGE PERIRECTAL ABSCESS: SHX1804

## 2017-12-13 ENCOUNTER — Other Ambulatory Visit: Payer: Self-pay

## 2017-12-13 ENCOUNTER — Emergency Department (HOSPITAL_BASED_OUTPATIENT_CLINIC_OR_DEPARTMENT_OTHER)
Admission: EM | Admit: 2017-12-13 | Discharge: 2017-12-13 | Disposition: A | Discharge status: Home / Self Care | Payer: Self-pay | Attending: Emergency Medicine | Admitting: Emergency Medicine

## 2017-12-13 ENCOUNTER — Encounter (HOSPITAL_BASED_OUTPATIENT_CLINIC_OR_DEPARTMENT_OTHER): Payer: Self-pay

## 2017-12-13 DIAGNOSIS — Z87891 Personal history of nicotine dependence: Secondary | ICD-10-CM | POA: Insufficient documentation

## 2017-12-13 DIAGNOSIS — A630 Anogenital (venereal) warts: Secondary | ICD-10-CM | POA: Insufficient documentation

## 2017-12-13 DIAGNOSIS — R197 Diarrhea, unspecified: Secondary | ICD-10-CM | POA: Insufficient documentation

## 2017-12-13 DIAGNOSIS — L0291 Cutaneous abscess, unspecified: Secondary | ICD-10-CM

## 2017-12-13 DIAGNOSIS — L0231 Cutaneous abscess of buttock: Secondary | ICD-10-CM | POA: Insufficient documentation

## 2017-12-13 DIAGNOSIS — I10 Essential (primary) hypertension: Secondary | ICD-10-CM | POA: Insufficient documentation

## 2017-12-13 LAB — URINALYSIS, ROUTINE W REFLEX MICROSCOPIC
Bilirubin Urine: NEGATIVE
Glucose, UA: NEGATIVE mg/dL
Ketones, ur: 40 mg/dL — AB
Leukocytes, UA: NEGATIVE
Nitrite: NEGATIVE
Protein, ur: NEGATIVE mg/dL
Specific Gravity, Urine: >1.03 — ABNORMAL HIGH (ref 1.005–1.030)
pH: 5.5 (ref 5.0–8.0)

## 2017-12-13 LAB — COMPREHENSIVE METABOLIC PANEL
ALT: 27 U/L (ref 17–63)
AST: 26 U/L (ref 15–41)
Albumin: 4.3 g/dL (ref 3.5–5.0)
Alkaline Phosphatase: 57 U/L (ref 38–126)
Anion gap: 10 (ref 5–15)
BUN: 15 mg/dL (ref 6–20)
CO2: 23 mmol/L (ref 22–32)
Calcium: 8.9 mg/dL (ref 8.9–10.3)
Chloride: 103 mmol/L (ref 101–111)
Creatinine, Ser: 1.05 mg/dL (ref 0.61–1.24)
GFR calc Af Amer: >60 mL/min (ref >60)
GFR calc non Af Amer: >60 mL/min (ref >60)
Glucose, Bld: 94 mg/dL (ref 65–99)
Potassium: 3.7 mmol/L (ref 3.5–5.1)
Sodium: 136 mmol/L (ref 135–145)
Total Bilirubin: 0.4 mg/dL (ref 0.3–1.2)
Total Protein: 7.8 g/dL (ref 6.5–8.1)

## 2017-12-13 LAB — CBC
HCT: 42.6 % (ref 39.0–52.0)
Hemoglobin: 14.5 g/dL (ref 13.0–17.0)
MCH: 29.9 pg (ref 26.0–34.0)
MCHC: 34 g/dL (ref 30.0–36.0)
MCV: 87.8 fL (ref 78.0–100.0)
Platelets: 247 10*3/uL (ref 150–400)
RBC: 4.85 MIL/uL (ref 4.22–5.81)
RDW: 11.9 % (ref 11.5–15.5)
WBC: 6.9 10*3/uL (ref 4.0–10.5)

## 2017-12-13 LAB — URINALYSIS, MICROSCOPIC (REFLEX)

## 2017-12-13 LAB — LIPASE, BLOOD: Lipase: 28 U/L (ref 11–51)

## 2017-12-13 MED ORDER — MORPHINE SULFATE (PF) 4 MG/ML IV SOLN
4.0000 mg | Freq: Once | INTRAVENOUS | Status: AC
  Administered 2017-12-13: 4 mg via INTRAVENOUS
  Filled 2017-12-13: qty 1

## 2017-12-13 MED ORDER — ONDANSETRON HCL 4 MG/2ML IJ SOLN
4.0000 mg | Freq: Once | INTRAMUSCULAR | Status: AC
  Administered 2017-12-13: 4 mg via INTRAVENOUS
  Filled 2017-12-13: qty 2

## 2017-12-13 MED ORDER — LIDOCAINE-EPINEPHRINE 1 %-1:100000 IJ SOLN
10.0000 mL | Freq: Once | INTRAMUSCULAR | Status: AC
  Administered 2017-12-13: 10 mL
  Filled 2017-12-13: qty 1

## 2017-12-13 MED ORDER — SODIUM CHLORIDE 0.9 % IV BOLUS (SEPSIS)
1000.0000 mL | Freq: Once | INTRAVENOUS | Status: AC
  Administered 2017-12-13: 1000 mL via INTRAVENOUS

## 2017-12-13 MED ORDER — LORAZEPAM 2 MG/ML IJ SOLN
1.0000 mg | Freq: Once | INTRAMUSCULAR | Status: AC
  Administered 2017-12-13: 1 mg via INTRAVENOUS
  Filled 2017-12-13: qty 1

## 2017-12-13 MED ORDER — LOPERAMIDE HCL 2 MG PO CAPS
4.0000 mg | ORAL_CAPSULE | Freq: Once | ORAL | Status: AC
  Administered 2017-12-13: 4 mg via ORAL
  Filled 2017-12-13: qty 2

## 2017-12-13 MED ORDER — SALICYLIC ACID 40 % EX PADS: 1.0000 "application " | MEDICATED_PAD | CUTANEOUS | 0 refills | Status: DC | PRN
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
## 2017-12-13 NOTE — ED Triage Notes (Signed)
C/o n/v/d started yesterday-states he "has something down there that needs to be cut out"-denies as rectal abscess but feels blood is coming from the area-NAD-steady gait

## 2017-12-13 NOTE — ED Notes (Signed)
NAD at this time. Pt is stable and going home.  

## 2017-12-13 NOTE — ED Notes (Signed)
Has had n/v/d x 3 days had company and they had same GI bug , also feels like he has  prutrsion on his bottom and states he mashed and something came out m also has had blood in stool

## 2018-02-19 IMAGING — MR MR LUMBAR SPINE W/O CM
4 of 5 series · 19 of 48 positions shown · non-contrast
Comparison: Lumbar MRI 06/22/2013

CLINICAL DATA: Low back pain.

EXAM:
MRI LUMBAR SPINE WITHOUT CONTRAST
TECHNIQUE: Multiplanar, multisequence MR imaging of the lumbar spine was
performed. No intravenous contrast was administered.

[Series 3: T1 · sagittal · 4.0mm · 0.55mm/px · 3 of 15 slices shown (1 of 2)]
[im 3/15]
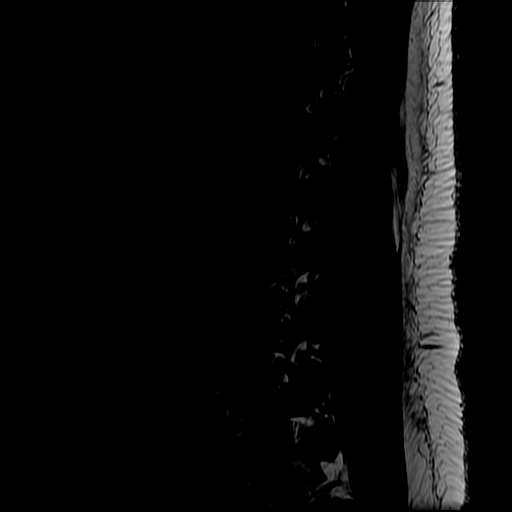
[im 9/15]
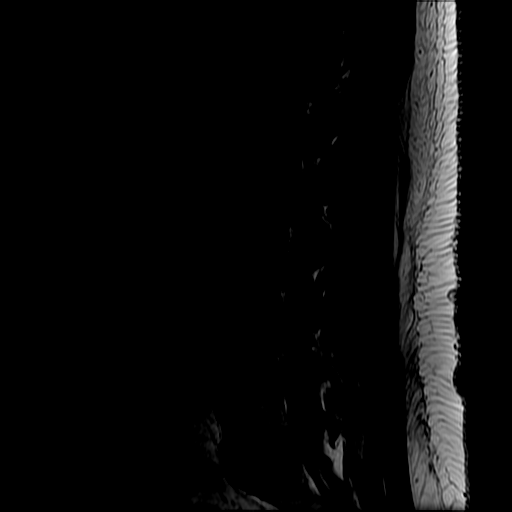
[im 15/15]
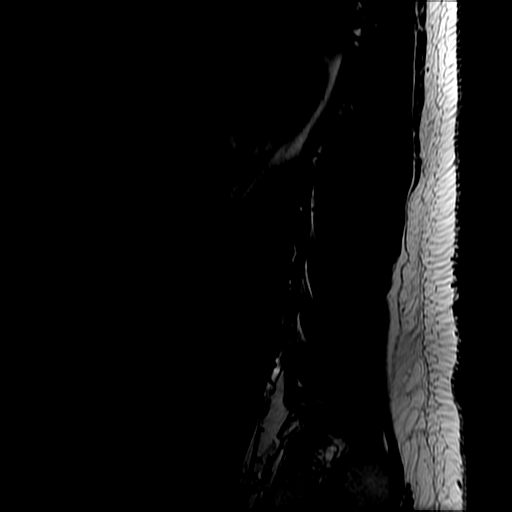

[Series 4: T2 post-contrast · sagittal · 4.0mm · 0.55mm/px · 5 of 15 slices shown]
[im 1/15]
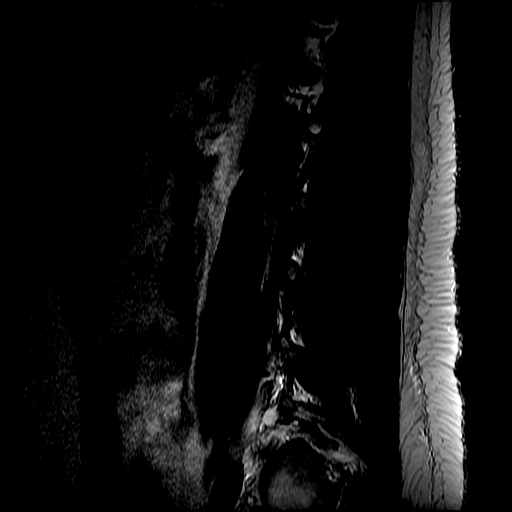
[im 4/15]
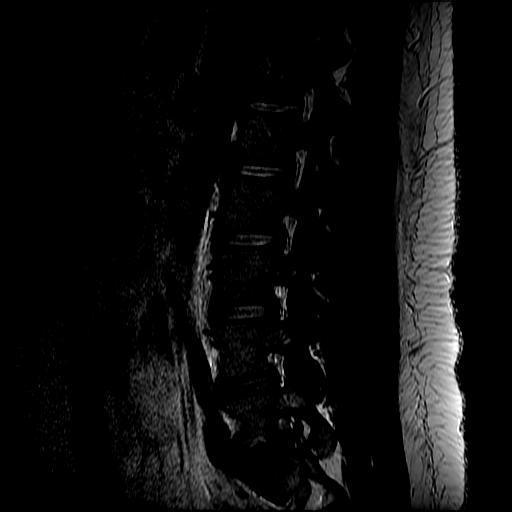
[im 8/15]
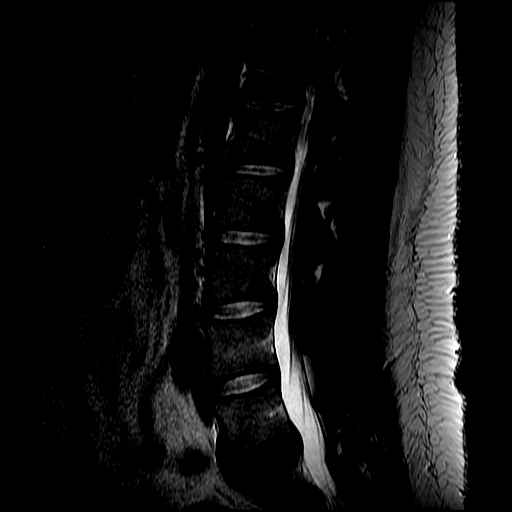
[im 11/15]
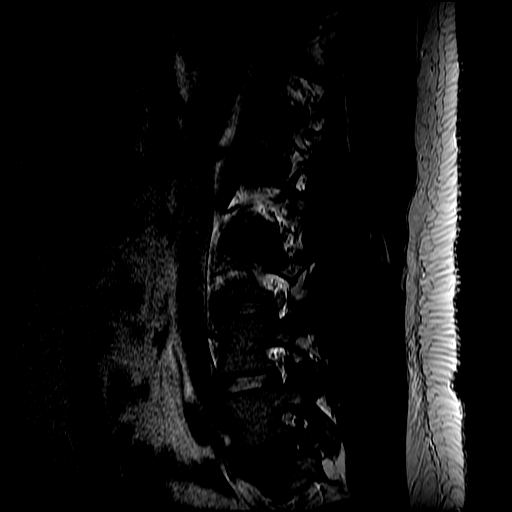
[im 15/15]
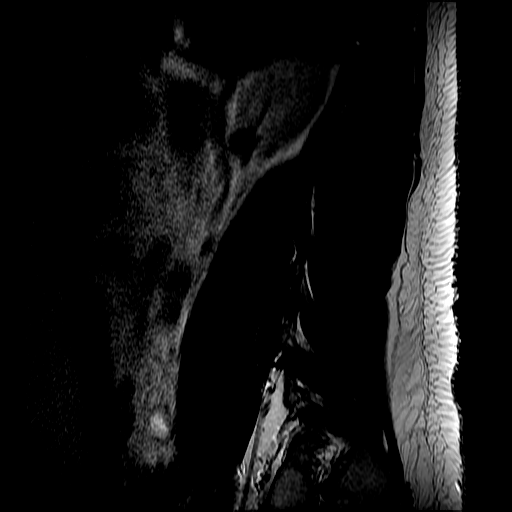

[Series 6: T2 · axial · 4.0mm · 0.39mm/px · z∈[-157,+43]mm · 8 of 44 slices shown]
[im 3/44]
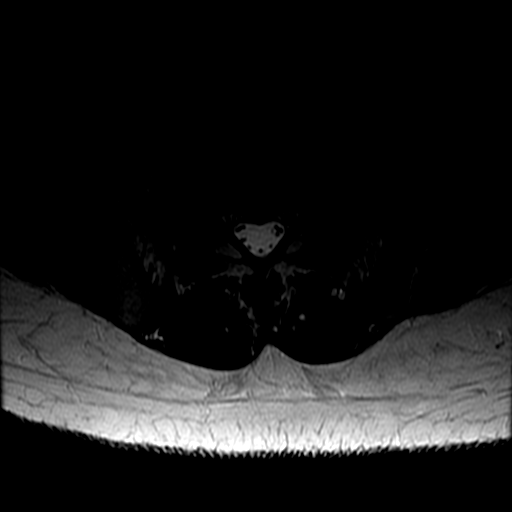
[im 6/44]
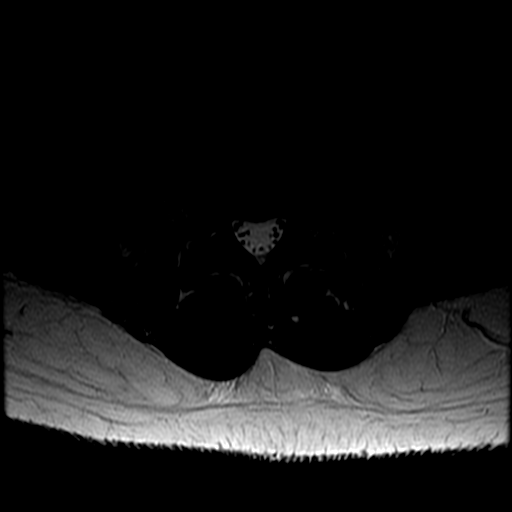
[im 9/44]
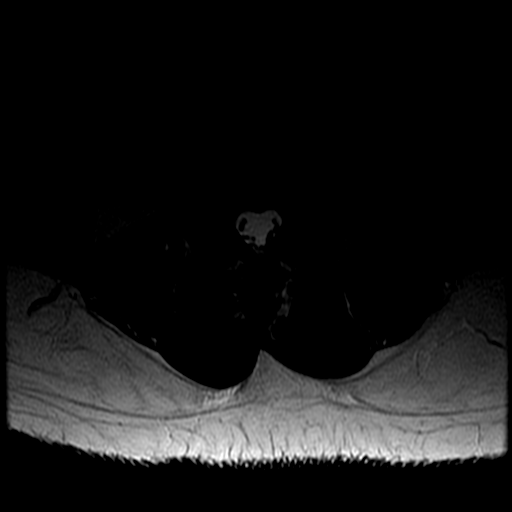
[im 15/44]
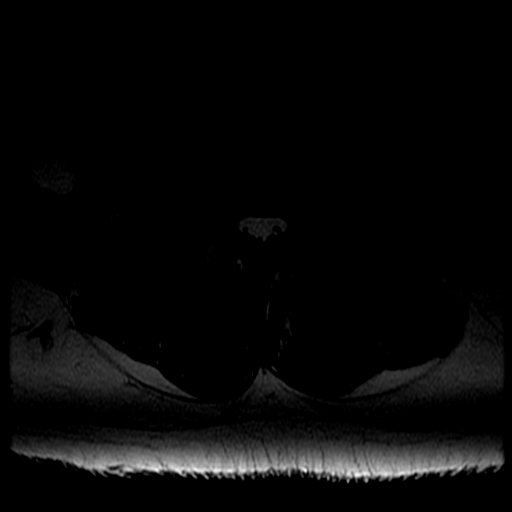
[im 21/44]
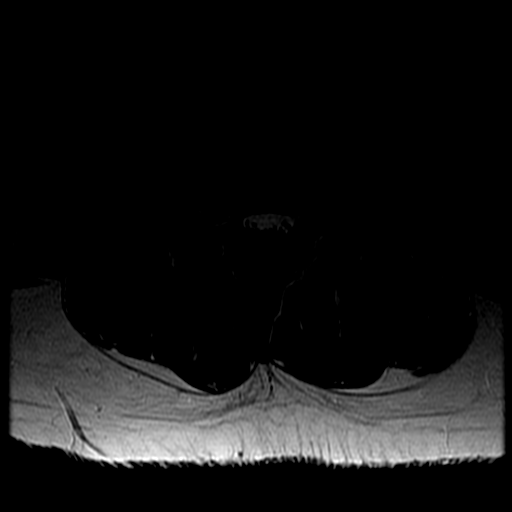
[im 23/44]
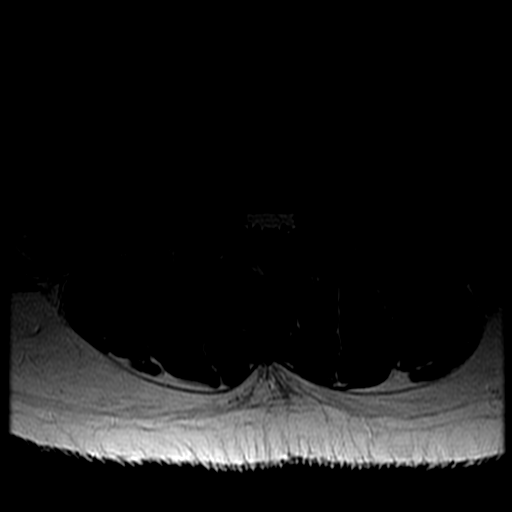
[im 26/44]
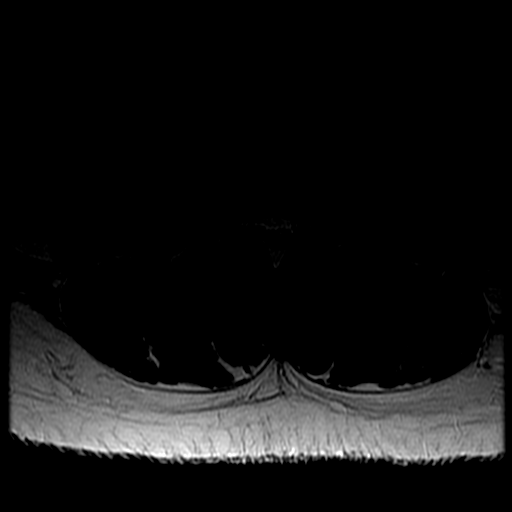
[im 38/44]
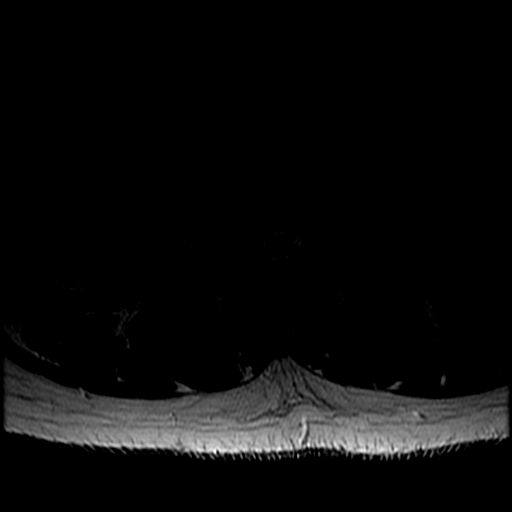

[Series 7: T1 · axial · 4.0mm · 0.39mm/px · z∈[-143,+43]mm · 3 of 44 slices shown (2 of 2)]
[im 6/44]
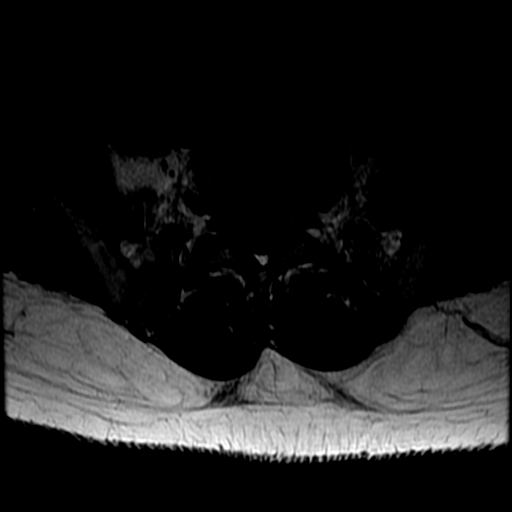
[im 23/44]
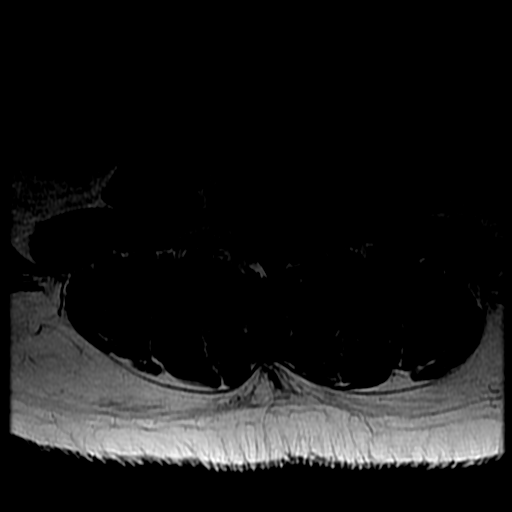
[im 38/44]
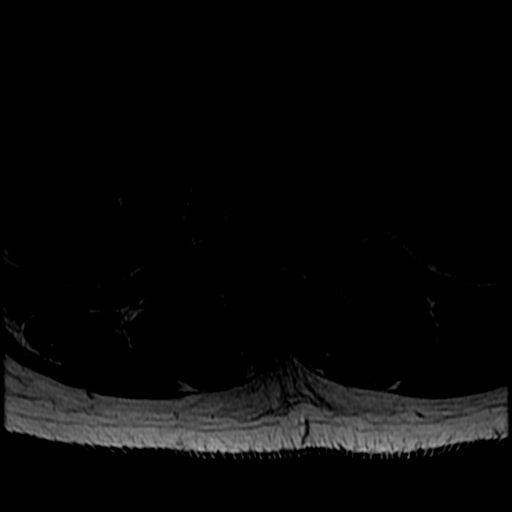

[19 of 48 positions shown; findings below may reference images not displayed]

FINDINGS: Normal lumbar alignment. Negative for fracture or mass. Conus
medullaris normal and terminates at L2. 15 mm cyst right kidney
unchanged.

L1-2:  Negative

L2-3:  Negative

L3-4:  Negative

L4-5:  Negative

L5-S1: Mild disc bulging. Large right-sided disc protrusion and
epidural hematoma has resolved. Mild endplate spurring is present.
Mild subarticular and foraminal stenosis bilaterally. No significant
disc protrusion
IMPRESSION: Large right-sided disc protrusion and epidural hematoma at L5-S1 has
resolved. Small residual central disc protrusion and osteophyte
formation is present causing subarticular and foraminal stenosis.
Other levels appear normal.

## 2018-04-22 ENCOUNTER — Emergency Department (HOSPITAL_BASED_OUTPATIENT_CLINIC_OR_DEPARTMENT_OTHER)
Admission: EM | Admit: 2018-04-22 | Discharge: 2018-04-22 | Disposition: A | Discharge status: Home / Self Care | Payer: Self-pay | Attending: Emergency Medicine | Admitting: Emergency Medicine

## 2018-04-22 ENCOUNTER — Encounter (HOSPITAL_BASED_OUTPATIENT_CLINIC_OR_DEPARTMENT_OTHER): Payer: Self-pay | Admitting: *Deleted

## 2018-04-22 ENCOUNTER — Other Ambulatory Visit: Payer: Self-pay

## 2018-04-22 DIAGNOSIS — L03032 Cellulitis of left toe: Secondary | ICD-10-CM | POA: Insufficient documentation

## 2018-04-22 DIAGNOSIS — B079 Viral wart, unspecified: Secondary | ICD-10-CM | POA: Insufficient documentation

## 2018-04-22 DIAGNOSIS — I10 Essential (primary) hypertension: Secondary | ICD-10-CM | POA: Insufficient documentation

## 2018-04-22 DIAGNOSIS — Z87891 Personal history of nicotine dependence: Secondary | ICD-10-CM | POA: Insufficient documentation

## 2018-04-22 MED ORDER — DOXYCYCLINE HYCLATE 100 MG PO CAPS: 100.0000 mg | ORAL_CAPSULE | Freq: Two times a day (BID) | ORAL | 0 refills | Status: DC

## 2018-04-22 MED ORDER — CLOTRIMAZOLE 1 % EX CREA: TOPICAL_CREAM | CUTANEOUS | 0 refills | Status: DC

## 2018-04-22 MED ORDER — DOXYCYCLINE HYCLATE 100 MG PO TABS
100.0000 mg | ORAL_TABLET | Freq: Once | ORAL | Status: AC
  Administered 2018-04-22: 100 mg via ORAL
  Filled 2018-04-22: qty 1

## 2018-04-22 MED ORDER — OXYCODONE-ACETAMINOPHEN 5-325 MG PO TABS
1.0000 | ORAL_TABLET | Freq: Once | ORAL | Status: AC
  Administered 2018-04-22: 1 via ORAL
  Filled 2018-04-22: qty 1

## 2018-04-22 MED ORDER — IBUPROFEN 200 MG PO TABS
600.0000 mg | ORAL_TABLET | Freq: Once | ORAL | Status: AC
Start: 2018-04-22 — End: 2018-04-22
  Administered 2018-04-22: 600 mg via ORAL
  Filled 2018-04-22: qty 1

## 2018-04-22 NOTE — ED Notes (Signed)
EDP into room, prior to RN assessment, see MD notes, orders received to medicate and d/c initiated. Care assumed at time of d/c.

## 2018-04-22 NOTE — Discharge Instructions (Signed)
Please follow up with a dermatologist regarding the wart on your left buttock  Return to the ER as needed for new or worsening symptoms

## 2018-04-22 NOTE — ED Triage Notes (Addendum)
C/o left foot pain with swelling since the end of June. States [place] was [place] in the [place] and concerned something rubbed up against his foot. States he has had some drainage from his "middle toe". Denies fevers. Toes with swelling noted to his 3rd toe. Also c/o pain to anal area. On exam by MD,  anal wart noted. Denies fevers. No other complaints noted
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
## 2018-05-09 ENCOUNTER — Encounter: Payer: Self-pay | Admitting: Infectious Disease

## 2018-05-10 ENCOUNTER — Encounter: Payer: Self-pay | Admitting: Infectious Disease

## 2018-05-10 ENCOUNTER — Ambulatory Visit (INDEPENDENT_AMBULATORY_CARE_PROVIDER_SITE_OTHER): Payer: Self-pay | Admitting: Pharmacist

## 2018-05-10 DIAGNOSIS — Z7252 High risk homosexual behavior: Secondary | ICD-10-CM
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
## 2018-05-11 LAB — BASIC METABOLIC PANEL
BUN: 13 mg/dL (ref 7–25)
CALCIUM: 9.5 mg/dL (ref 8.6–10.3)
CO2: 29 mmol/L (ref 20–32)
CREATININE: 1.04 mg/dL (ref 0.60–1.35)
Chloride: 102 mmol/L (ref 98–110)
GLUCOSE: 101 mg/dL — AB (ref 65–99)
POTASSIUM: 4 mmol/L (ref 3.5–5.3)
SODIUM: 139 mmol/L (ref 135–146)

## 2018-05-11 LAB — HIV ANTIBODY (ROUTINE TESTING W REFLEX): HIV: NONREACTIVE

## 2018-05-11 LAB — HEPATITIS B SURFACE ANTIBODY,QUALITATIVE: Hep B S Ab: REACTIVE — AB

## 2018-05-11 LAB — RPR: RPR: NONREACTIVE

## 2018-05-11 LAB — URINE CYTOLOGY ANCILLARY ONLY
CHLAMYDIA, DNA PROBE: NEGATIVE
Neisseria Gonorrhea: NEGATIVE

## 2018-05-11 LAB — HEPATITIS B SURFACE ANTIGEN: Hepatitis B Surface Ag: NONREACTIVE

## 2018-05-11 NOTE — Addendum Note (Signed)
Addended by: Aggie CosierKUPPELWEISER, Salma Walrond L on: 05/11/2018 10:57 AM   Modules accepted: Level of Service

## 2018-05-14 ENCOUNTER — Telehealth: Payer: Self-pay | Admitting: Pharmacist

## 2018-05-14 NOTE — Telephone Encounter (Signed)
error 

## 2018-05-14 NOTE — Telephone Encounter (Signed)
Called patient to let him know that his HIV antibody was negative.  I sent in his application for uninsured PrEP for Truvada to [employer]. I told him I would call him when he is approved.

## 2018-05-16 ENCOUNTER — Telehealth: Payer: Self-pay | Admitting: Pharmacist

## 2018-05-16 DIAGNOSIS — Z7252 High risk homosexual behavior: Secondary | ICD-10-CM

## 2018-05-16 MED ORDER — EMTRICITABINE-TENOFOVIR DF 200-300 MG PO TABS: 1.0000 | ORAL_TABLET | Freq: Every day | ORAL | 0 refills | Status: DC

## 2018-05-16 MED FILL — TRUVADA 200-300 MG TABS: 200-300 | 30 days supply | Qty: 30 | Fill #0

## 2018-05-16 NOTE — Telephone Encounter (Signed)
Patient approved to receive Truvada for PrEP through [employer]. [facility] and he will pick it up today.

## 2018-05-21 ENCOUNTER — Emergency Department (HOSPITAL_COMMUNITY)
Admission: EM | Admit: 2018-05-21 | Discharge: 2018-05-21 | Disposition: A | Discharge status: Home / Self Care | Payer: Self-pay | Attending: Emergency Medicine | Admitting: Emergency Medicine

## 2018-05-21 ENCOUNTER — Encounter (HOSPITAL_COMMUNITY): Payer: Self-pay | Admitting: Emergency Medicine

## 2018-05-21 DIAGNOSIS — R112 Nausea with vomiting, unspecified: Secondary | ICD-10-CM | POA: Insufficient documentation

## 2018-05-21 DIAGNOSIS — I10 Essential (primary) hypertension: Secondary | ICD-10-CM | POA: Insufficient documentation

## 2018-05-21 DIAGNOSIS — R030 Elevated blood-pressure reading, without diagnosis of hypertension: Secondary | ICD-10-CM | POA: Insufficient documentation

## 2018-05-21 DIAGNOSIS — F17210 Nicotine dependence, cigarettes, uncomplicated: Secondary | ICD-10-CM | POA: Insufficient documentation

## 2018-05-21 DIAGNOSIS — R197 Diarrhea, unspecified: Secondary | ICD-10-CM | POA: Insufficient documentation

## 2018-05-21 LAB — CBC
HEMATOCRIT: 41.2 % (ref 39.0–52.0)
HEMOGLOBIN: 13.4 g/dL (ref 13.0–17.0)
MCH: 29.2 pg (ref 26.0–34.0)
MCHC: 32.5 g/dL (ref 30.0–36.0)
MCV: 89.8 fL (ref 78.0–100.0)
Platelets: 214 10*3/uL (ref 150–400)
RBC: 4.59 MIL/uL (ref 4.22–5.81)
RDW: 12.6 % (ref 11.5–15.5)
WBC: 5.6 10*3/uL (ref 4.0–10.5)

## 2018-05-21 LAB — COMPREHENSIVE METABOLIC PANEL
ALBUMIN: 3.9 g/dL (ref 3.5–5.0)
ALT: 25 U/L (ref 0–44)
ANION GAP: 7 (ref 5–15)
AST: 26 U/L (ref 15–41)
Alkaline Phosphatase: 88 U/L (ref 38–126)
BILIRUBIN TOTAL: 0.4 mg/dL (ref 0.3–1.2)
BUN: 16 mg/dL (ref 6–20)
CO2: 28 mmol/L (ref 22–32)
Calcium: 9.1 mg/dL (ref 8.9–10.3)
Chloride: 104 mmol/L (ref 98–111)
Creatinine, Ser: 0.89 mg/dL (ref 0.61–1.24)
GFR calc Af Amer: >60 mL/min (ref >60)
GFR calc non Af Amer: >60 mL/min (ref >60)
GLUCOSE: 100 mg/dL — AB (ref 70–99)
POTASSIUM: 4.1 mmol/L (ref 3.5–5.1)
SODIUM: 139 mmol/L (ref 135–145)
Total Protein: 7.4 g/dL (ref 6.5–8.1)

## 2018-05-21 LAB — URINALYSIS, ROUTINE W REFLEX MICROSCOPIC
BILIRUBIN URINE: NEGATIVE
GLUCOSE, UA: NEGATIVE mg/dL
HGB URINE DIPSTICK: NEGATIVE
KETONES UR: NEGATIVE mg/dL
Leukocytes, UA: NEGATIVE
NITRITE: NEGATIVE
PH: 7 (ref 5.0–8.0)
Protein, ur: NEGATIVE mg/dL
SPECIFIC GRAVITY, URINE: 1.017 (ref 1.005–1.030)

## 2018-05-21 LAB — LIPASE, BLOOD: Lipase: 75 U/L — ABNORMAL HIGH (ref 11–51)

## 2018-05-21 MED ORDER — ONDANSETRON 4 MG PO TBDP
4.0000 mg | ORAL_TABLET | Freq: Once | ORAL | Status: AC
  Administered 2018-05-21: 4 mg via ORAL
  Filled 2018-05-21: qty 1

## 2018-05-21 MED ORDER — ONDANSETRON HCL 4 MG PO TABS: 4.0000 mg | ORAL_TABLET | Freq: Three times a day (TID) | ORAL | 0 refills | Status: DC | PRN

## 2018-05-21 MED ORDER — SODIUM CHLORIDE 0.9 % IV BOLUS
1000.0000 mL | Freq: Once | INTRAVENOUS | Status: AC
  Administered 2018-05-21: 1000 mL via INTRAVENOUS

## 2018-05-21 MED ORDER — DICYCLOMINE HCL 20 MG PO TABS: 20.0000 mg | ORAL_TABLET | Freq: Two times a day (BID) | ORAL | 0 refills | Status: DC

## 2018-05-21 MED ORDER — DICYCLOMINE HCL 10 MG PO CAPS
20.0000 mg | ORAL_CAPSULE | Freq: Once | ORAL | Status: AC
  Administered 2018-05-21: 20 mg via ORAL
  Filled 2018-05-21: qty 2

## 2018-05-21 NOTE — ED Notes (Signed)
PATIENT EATING AND DRINKING WITHOUT PAIN OR NAUSEA.

## 2018-05-21 NOTE — Discharge Instructions (Addendum)
°  Please follow-up with a primary care physician regarding her high blood pressure.  Return to the emergency department for the following reason.   Get help right away if: You have chest pain. You feel extremely weak or you faint. You see blood in your vomit. Your vomit looks like coffee grounds. You have bloody or black stools or stools that look like tar. You have a severe headache, a stiff neck, or both. You have a rash. You have severe pain, cramping, or bloating in your abdomen. You have trouble breathing or you are breathing very quickly. Your heart is beating very quickly. Your skin feels cold and clammy. You feel confused. You have pain when you urinate. You have signs of dehydration, such as: Dark urine, very little urine, or no urine. Cracked lips. Dry mouth. Sunken eyes. Sleepiness. Weakness.
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
## 2018-05-21 NOTE — ED Triage Notes (Signed)
Pt reports that yesterday day after eating a a church they were visiting he felt bad with n/v/d and abd cramps.

## 2018-06-02 ENCOUNTER — Other Ambulatory Visit: Payer: Self-pay

## 2018-06-02 ENCOUNTER — Emergency Department (HOSPITAL_BASED_OUTPATIENT_CLINIC_OR_DEPARTMENT_OTHER)
Admission: EM | Admit: 2018-06-02 | Discharge: 2018-06-02 | Disposition: A | Discharge status: Home / Self Care | Payer: Self-pay | Attending: Emergency Medicine | Admitting: Emergency Medicine

## 2018-06-02 ENCOUNTER — Encounter (HOSPITAL_BASED_OUTPATIENT_CLINIC_OR_DEPARTMENT_OTHER): Payer: Self-pay | Admitting: Adult Health

## 2018-06-02 DIAGNOSIS — I10 Essential (primary) hypertension: Secondary | ICD-10-CM | POA: Insufficient documentation

## 2018-06-02 DIAGNOSIS — L723 Sebaceous cyst: Secondary | ICD-10-CM | POA: Insufficient documentation

## 2018-06-02 DIAGNOSIS — F17210 Nicotine dependence, cigarettes, uncomplicated: Secondary | ICD-10-CM | POA: Insufficient documentation

## 2018-06-02 DIAGNOSIS — Z79899 Other long term (current) drug therapy: Secondary | ICD-10-CM | POA: Insufficient documentation

## 2018-06-02 MED ORDER — LIDOCAINE HCL (PF) 1 % IJ SOLN
5.0000 mL | Freq: Once | INTRAMUSCULAR | Status: AC
  Administered 2018-06-02: 5 mL via INTRADERMAL
  Filled 2018-06-02: qty 5

## 2018-06-02 MED ORDER — DOXYCYCLINE HYCLATE 100 MG PO CAPS: 100.0000 mg | ORAL_CAPSULE | Freq: Two times a day (BID) | ORAL | 0 refills | Status: DC

## 2018-06-02 NOTE — ED Notes (Signed)
ED Provider at bedside. 
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
## 2018-06-02 NOTE — ED Notes (Signed)
Pt given Rx x 1 for doxycycline

## 2018-06-02 NOTE — ED Triage Notes (Signed)
PResents with fluctuant nump to right side of chin. Area is red and not draining. It began yesterday and is getting bigger.

## 2018-06-02 NOTE — Discharge Instructions (Signed)
Get help right away if: °You have severe pain or bleeding. °You cannot eat or drink without vomiting. °You have decreased urine output. °You become short of breath. °You have chest pain. °You cough up blood. °The area where the incision and drainage occurred becomes numb or it tingles. °

## 2018-06-13 ENCOUNTER — Ambulatory Visit (INDEPENDENT_AMBULATORY_CARE_PROVIDER_SITE_OTHER): Payer: Self-pay | Admitting: Pharmacist

## 2018-06-13 DIAGNOSIS — Z7252 High risk homosexual behavior: Secondary | ICD-10-CM

## 2018-06-13 MED ORDER — EMTRICITABINE-TENOFOVIR DF 200-300 MG PO TABS: 1.0000 | ORAL_TABLET | Freq: Every day | ORAL | 2 refills | Status: DC

## 2018-06-13 MED FILL — TRUVADA 200-300 MG TABS: 200-300 | 30 days supply | Qty: 30 | Fill #0
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
## 2018-06-14 LAB — BASIC METABOLIC PANEL
BUN: 13 mg/dL (ref 7–25)
CALCIUM: 9.1 mg/dL (ref 8.6–10.3)
CO2: 29 mmol/L (ref 20–32)
CREATININE: 1.23 mg/dL (ref 0.60–1.35)
Chloride: 105 mmol/L (ref 98–110)
GLUCOSE: 87 mg/dL (ref 65–99)
POTASSIUM: 3.7 mmol/L (ref 3.5–5.3)
Sodium: 140 mmol/L (ref 135–146)

## 2018-06-14 LAB — HIV ANTIBODY (ROUTINE TESTING W REFLEX): HIV 1&2 Ab, 4th Generation: NONREACTIVE

## 2018-06-18 ENCOUNTER — Telehealth: Payer: Self-pay | Admitting: Pharmacist

## 2018-06-18 NOTE — Telephone Encounter (Signed)
Patient's HIV antibody came back negative. Will send in 3 more months of Truvada.

## 2018-07-09 MED FILL — TRUVADA 200-300 MG TABS: 200-300 | 30 days supply | Qty: 30 | Fill #1

## 2018-08-10 MED FILL — TRUVADA 200-300 MG TABS: 200-300 | 30 days supply | Qty: 30 | Fill #2

## 2018-09-11 ENCOUNTER — Encounter: Payer: Self-pay | Admitting: Infectious Disease

## 2018-09-11 ENCOUNTER — Ambulatory Visit (INDEPENDENT_AMBULATORY_CARE_PROVIDER_SITE_OTHER): Payer: Self-pay | Admitting: Pharmacist

## 2018-09-11 ENCOUNTER — Encounter: Payer: Self-pay | Admitting: *Deleted

## 2018-09-11 DIAGNOSIS — Z7252 High risk homosexual behavior: Secondary | ICD-10-CM
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
## 2018-09-12 ENCOUNTER — Telehealth: Payer: Self-pay | Admitting: Pharmacist

## 2018-09-12 DIAGNOSIS — Z7252 High risk homosexual behavior: Secondary | ICD-10-CM

## 2018-09-12 LAB — HIV ANTIBODY (ROUTINE TESTING W REFLEX): HIV: NONREACTIVE

## 2018-09-12 MED ORDER — EMTRICITABINE-TENOFOVIR AF 200-25 MG PO TABS: 1.0000 | ORAL_TABLET | Freq: Every day | ORAL | 2 refills | Status: DC

## 2018-09-12 NOTE — Telephone Encounter (Signed)
Called patient to let him know that his HIV antibody was negative.  Will send in 3 months of Descovy.  

## 2018-09-13 MED FILL — DESCOVY 200-25 MG TABS: 200-25 | 30 days supply | Qty: 30 | Fill #0

## 2018-09-14 NOTE — Addendum Note (Signed)
Addended by: Robinette HainesKUPPELWEISER, CASSIE L on: 09/14/2018 02:46 PM   Modules accepted: Level of Service

## 2018-10-08 MED FILL — DESCOVY 200-25 MG TABS: 200-25 | 30 days supply | Qty: 30 | Fill #1

## 2018-11-05 ENCOUNTER — Telehealth: Payer: Self-pay | Admitting: Pharmacy Technician

## 2018-11-05 MED FILL — DESCOVY 200-25 MG TABS: 200-25 | 30 days supply | Qty: 30 | Fill #2

## 2018-11-05 NOTE — Telephone Encounter (Signed)
Needs to renew patient assistance  Left voicemail.  Need proof of income

## 2018-11-06 ENCOUNTER — Telehealth: Payer: Self-pay | Admitting: Pharmacy Technician

## 2018-11-06 NOTE — Telephone Encounter (Signed)
Must send paystubs to get patient assistance for Feb refill of Descovy.  Left voicemail.

## 2018-11-08 ENCOUNTER — Telehealth: Payer: Self-pay | Admitting: Pharmacy Technician

## 2018-11-08 NOTE — Telephone Encounter (Signed)
I left a voicemail message asking Jeremy Bell to call about medication assistance.  He must provide me proof of income before he can be approved for Descovy with [employer] Advancing Access.  [name]. [name] CPhT Specialty Pharmacy Patient [facility] Phone: [phone] Fax:  [phone]

## 2018-12-11 ENCOUNTER — Telehealth: Payer: Self-pay | Admitting: Pharmacy Technician

## 2018-12-11 ENCOUNTER — Ambulatory Visit (INDEPENDENT_AMBULATORY_CARE_PROVIDER_SITE_OTHER): Payer: Self-pay | Admitting: Pharmacist

## 2018-12-11 DIAGNOSIS — Z7252 High risk homosexual behavior: Secondary | ICD-10-CM

## 2018-12-11 NOTE — Telephone Encounter (Signed)
[facility] Patient Advocate Encounter   Patient application for [employer] Advancing Access Patient Assistance Program for Descovy has been faxed with accompanying income documents on 12/11/2018  I have spoken with the patient about the process of applying.  Once approved, patient would like his medications mailed to his home address.  Patient knows to call the office with questions or concerns.  Beulah Gandy, CPhT Specialty Pharmacy Patient [facility] for Infectious Disease Phone: [phone] Fax: [phone] 12/11/2018 4:19 PM

## 2018-12-11 NOTE — Telephone Encounter (Signed)
[facility] Patient Advocate Encounter  Spoke with patient this morning, he is bringing income documents in with him to the appointment 12/11/2018 at 3:15pm.  [employer] Advancing [employer] application has been filled out and will be faxed once patient arrives for appointment and has provided documents.
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
## 2018-12-12 ENCOUNTER — Telehealth: Payer: Self-pay | Admitting: Pharmacist

## 2018-12-12 DIAGNOSIS — Z7252 High risk homosexual behavior: Secondary | ICD-10-CM

## 2018-12-12 LAB — BASIC METABOLIC PANEL
BUN: 12 mg/dL (ref 7–25)
CO2: 27 mmol/L (ref 20–32)
Calcium: 9.7 mg/dL (ref 8.6–10.3)
Chloride: 103 mmol/L (ref 98–110)
Creat: 1.12 mg/dL (ref 0.60–1.35)
Glucose, Bld: 95 mg/dL (ref 65–99)
Potassium: 3.9 mmol/L (ref 3.5–5.3)
Sodium: 139 mmol/L (ref 135–146)

## 2018-12-12 LAB — RPR: RPR: NONREACTIVE

## 2018-12-12 LAB — HIV ANTIBODY (ROUTINE TESTING W REFLEX): HIV 1&2 Ab, 4th Generation: NONREACTIVE

## 2018-12-12 MED ORDER — EMTRICITABINE-TENOFOVIR AF 200-25 MG PO TABS: 1.0000 | ORAL_TABLET | Freq: Every day | ORAL | 2 refills | Status: DC

## 2018-12-12 NOTE — Telephone Encounter (Signed)
Called patient to let him know that his HIV antibody was negative.  Will send in 3 more months of Descovy.  

## 2018-12-13 LAB — URINE CYTOLOGY ANCILLARY ONLY
CHLAMYDIA, DNA PROBE: NEGATIVE
NEISSERIA GONORRHEA: NEGATIVE

## 2018-12-13 MED FILL — DESCOVY 200-25 MG TABS: 200-25 | 30 days supply | Qty: 30 | Fill #0

## 2018-12-14 NOTE — Telephone Encounter (Signed)
[facility] Patient Advocate Encounter   Patient has been approved for [employer] Advancing Access Patient Assistance Program for Descovy from 12/13/2018 to 10/10/2019. This assistance will make the patient's copay $0.  I have spoken with the patient and they will have the medication mailed to their home to arrive on 12/14/2018.  The billing information is as follows and has been shared with [facility]  Member ID: [id number] RxBin: [id number] PCN: [id number] Group: [id number]  Patient knows to call the office with questions or concerns.  Beulah Gandy, CPhT Specialty Pharmacy Patient [facility] for Infectious Disease Phone: [phone] Fax: [phone] 12/14/2018 11:21 AM

## 2019-01-11 MED FILL — DESCOVY 200-25 MG TABS: 200-25 | 30 days supply | Qty: 30 | Fill #1

## 2019-01-28 ENCOUNTER — Encounter (HOSPITAL_BASED_OUTPATIENT_CLINIC_OR_DEPARTMENT_OTHER): Payer: Self-pay | Admitting: *Deleted

## 2019-01-28 ENCOUNTER — Emergency Department (HOSPITAL_BASED_OUTPATIENT_CLINIC_OR_DEPARTMENT_OTHER)
Admission: EM | Admit: 2019-01-28 | Discharge: 2019-01-28 | Disposition: A | Discharge status: Home / Self Care | Payer: Self-pay | Attending: Emergency Medicine | Admitting: Emergency Medicine

## 2019-01-28 ENCOUNTER — Other Ambulatory Visit: Payer: Self-pay

## 2019-01-28 DIAGNOSIS — J988 Other specified respiratory disorders: Secondary | ICD-10-CM

## 2019-01-28 DIAGNOSIS — Z79899 Other long term (current) drug therapy: Secondary | ICD-10-CM | POA: Insufficient documentation

## 2019-01-28 DIAGNOSIS — B349 Viral infection, unspecified: Secondary | ICD-10-CM | POA: Insufficient documentation

## 2019-01-28 DIAGNOSIS — B9789 Other viral agents as the cause of diseases classified elsewhere: Secondary | ICD-10-CM

## 2019-01-28 DIAGNOSIS — F17210 Nicotine dependence, cigarettes, uncomplicated: Secondary | ICD-10-CM | POA: Insufficient documentation

## 2019-01-28 DIAGNOSIS — I10 Essential (primary) hypertension: Secondary | ICD-10-CM | POA: Insufficient documentation

## 2019-01-28 MED ORDER — FLUTICASONE PROPIONATE 50 MCG/ACT NA SUSP: 2.0000 | Freq: Every day | NASAL | 0 refills | Status: DC

## 2019-01-28 MED ORDER — CETIRIZINE HCL 10 MG PO TABS: 10.0000 mg | ORAL_TABLET | Freq: Every day | ORAL | 0 refills | Status: DC

## 2019-01-28 NOTE — ED Triage Notes (Signed)
Cough for 4 days. Stuffy nose. No relief with OTC cold meds. Fever 2 days later. He was exposed to sick coworker.

## 2019-01-28 NOTE — Discharge Instructions (Signed)
Return to the ER if you develop trouble breathing, coughing up blood, bloody nasal drainage, or he do not improve in 7 days.  Otherwise, follow-up with a primary care physician.  In this current pandemic you are presumed to possibly have COVID-19.  You need to self quarantine for at least 7 days and have no symptoms for 3 days at the end.     Person Under Monitoring Name: Jeremy Bell  [place] [address] [place] [id number]   Infection Prevention Recommendations for Individuals Confirmed to have, or Being Evaluated for, 2019 Novel Coronavirus (COVID-19) Infection Who Receive Care at Home  Individuals who are confirmed to have, or are being evaluated for, COVID-19 should follow the prevention steps below until a healthcare provider or local or state health department says they can return to normal activities.  Stay home except to get medical care You should restrict activities outside your home, except for getting medical care. Do not go to work, school, or public areas, and do not use public transportation or taxis.  Call ahead before visiting your doctor Before your medical appointment, call the healthcare provider and tell them that you have, or are being evaluated for, COVID-19 infection. This will help the healthcare providers office take steps to keep other people from getting infected. Ask your healthcare provider to call the local or state health department.  Monitor your symptoms Seek prompt medical attention if your illness is worsening (e.g., difficulty breathing). Before going to your medical appointment, call the healthcare provider and tell them that you have, or are being evaluated for, COVID-19 infection. Ask your healthcare provider to call the local or state health department.  Wear a facemask You should wear a facemask that covers your nose and mouth when you are in the same room with other people and when you visit a healthcare provider. People who live  with or visit you should also wear a facemask while they are in the same room with you.  Separate yourself from other people in your home As much as possible, you should stay in a different room from other people in your home. Also, you should use a separate bathroom, if available.  Avoid sharing household items You should not share dishes, drinking glasses, cups, eating utensils, towels, bedding, or other items with other people in your home. After using these items, you should wash them thoroughly with soap and water.  Cover your coughs and sneezes Cover your mouth and nose with a tissue when you cough or sneeze, or you can cough or sneeze into your sleeve. Throw used tissues in a lined trash can, and immediately wash your hands with soap and water for at least 20 seconds or use an alcohol-based hand rub.  Wash your [employer]hands Wash your hands often and thoroughly with soap and water for at least 20 seconds. You can use an alcohol-based hand sanitizer if soap and water are not available and if your hands are not visibly dirty. Avoid touching your eyes, nose, and mouth with unwashed hands.   Prevention Steps for Caregivers and Household Members of Individuals Confirmed to have, or Being Evaluated for, COVID-19 Infection Being Cared for in the Home  If you live with, or provide care at home for, a person confirmed to have, or being evaluated for, COVID-19 infection please follow these guidelines to prevent infection:  Follow healthcare providers instructions Make sure that you understand and can help the patient follow any healthcare provider instructions for all care.  Provide for the patients basic needs You should help the patient with basic needs in the home and provide support for getting groceries, prescriptions, and other personal needs.  Monitor the patients symptoms If they are getting sicker, call his or her medical provider and tell them that the patient has, or is being  evaluated for, COVID-19 infection. This will help the healthcare providers office take steps to keep other people from getting infected. Ask the healthcare provider to call the local or state health department.  Limit the number of people who have contact with the patient If possible, have only one caregiver for the patient. Other household members should stay in another home or place of residence. If this is not possible, they should stay in another room, or be separated from the patient as much as possible. Use a separate bathroom, if available. Restrict visitors who do not have an essential need to be in the home.  Keep older adults, very young children, and other sick people away from the patient Keep older adults, very young children, and those who have compromised immune systems or chronic health conditions away from the patient. This includes people with chronic heart, lung, or kidney conditions, diabetes, and cancer.  Ensure good ventilation Make sure that shared spaces in the home have good air flow, such as from an air conditioner or an opened window, weather permitting.  Wash your hands often Wash your hands often and thoroughly with soap and water for at least 20 seconds. You can use an alcohol based hand sanitizer if soap and water are not available and if your hands are not visibly dirty. Avoid touching your eyes, nose, and mouth with unwashed hands. Use disposable paper towels to dry your hands. If not available, use dedicated cloth towels and replace them when they become wet.  Wear a facemask and gloves Wear a disposable facemask at all times in the room and gloves when you touch or have contact with the patients blood, body fluids, and/or secretions or excretions, such as sweat, saliva, sputum, nasal mucus, vomit, urine, or feces.  Ensure the mask fits over your nose and mouth tightly, and do not touch it during use. Throw out disposable facemasks and gloves after using  them. Do not reuse. Wash your hands immediately after removing your facemask and gloves. If your personal clothing becomes contaminated, carefully remove clothing and launder. Wash your hands after handling contaminated clothing. Place all used disposable facemasks, gloves, and other waste in a lined container before disposing them with other household waste. Remove gloves and wash your hands immediately after handling these items.  Do not share dishes, glasses, or other household items with the patient Avoid sharing household items. You should not share dishes, drinking glasses, cups, eating utensils, towels, bedding, or other items with a patient who is confirmed to have, or being evaluated for, COVID-19 infection. After the person uses these items, you should wash them thoroughly with soap and water.  Wash laundry thoroughly Immediately remove and wash clothes or bedding that have blood, body fluids, and/or secretions or excretions, such as sweat, saliva, sputum, nasal mucus, vomit, urine, or feces, on them. Wear gloves when handling laundry from the patient. Read and follow directions on labels of laundry or clothing items and detergent. In general, wash and dry with the warmest temperatures recommended on the label.  Clean all areas the individual has used often Clean all touchable surfaces, such as counters, tabletops, doorknobs, bathroom fixtures, toilets, phones, keyboards, tablets,  and bedside tables, every day. Also, clean any surfaces that may have blood, body fluids, and/or secretions or excretions on them. Wear gloves when cleaning surfaces the patient has come in contact with. Use a diluted bleach solution (e.g., dilute bleach with 1 part bleach and 10 parts water) or a household disinfectant with a label that says EPA-registered for coronaviruses. To make a bleach solution at home, add 1 tablespoon of bleach to 1 quart (4 cups) of water. For a larger supply, add  cup of bleach to 1  gallon (16 cups) of water. Read labels of cleaning products and follow recommendations provided on product labels. Labels contain instructions for safe and effective use of the cleaning product including precautions you should take when applying the product, such as wearing gloves or eye protection and making sure you have good ventilation during use of the product. Remove gloves and wash hands immediately after cleaning.  Monitor yourself for signs and symptoms of illness Caregivers and household members are considered close contacts, should monitor their health, and will be asked to limit movement outside of the home to the extent possible. Follow the monitoring steps for close contacts listed on the symptom monitoring form.   ? If you have additional questions, contact your local health department or call the epidemiologist on call at [phone] (available 24/7). ? This guidance is subject to change. For the most up-to-date guidance from [facility], please refer to their website: TripMetro.huhttps://www.cdc.gov/coronavirus/2019-ncov/hcp/guidance-prevent-spread.html
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
## 2019-02-11 ENCOUNTER — Encounter: Payer: Self-pay | Admitting: Family Medicine

## 2019-02-11 ENCOUNTER — Telehealth: Payer: Self-pay | Admitting: *Deleted

## 2019-02-11 ENCOUNTER — Other Ambulatory Visit: Payer: Self-pay

## 2019-02-11 ENCOUNTER — Ambulatory Visit: Payer: Self-pay | Attending: Family Medicine | Admitting: Family Medicine

## 2019-02-11 DIAGNOSIS — R0981 Nasal congestion: Secondary | ICD-10-CM

## 2019-02-11 DIAGNOSIS — R509 Fever, unspecified: Secondary | ICD-10-CM

## 2019-02-11 DIAGNOSIS — R51 Headache: Secondary | ICD-10-CM

## 2019-02-11 DIAGNOSIS — Z20822 Contact with and (suspected) exposure to covid-19: Secondary | ICD-10-CM

## 2019-02-11 DIAGNOSIS — R6889 Other general symptoms and signs: Secondary | ICD-10-CM

## 2019-02-11 MED ORDER — AZITHROMYCIN 250 MG PO TABS: ORAL_TABLET | ORAL | 0 refills | Status: DC
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
## 2019-02-11 NOTE — Progress Notes (Signed)
Per he was in the hospital for fever and sinus problem. Per the fever comes off and on.   Per pt he's been out of work for 2 wks and now his work is needing note before he can go back to work.   Patient was in hospital 01/28/19 and they took him out for 7 days. But was not able to get an appt the days he was available to come in. Per pt he was only able to come in only on Monday last week and last Friday.  Per pt he's nervious a lot due to this Covid-19  Per pt he's been getting a headache more then regular.

## 2019-02-11 NOTE — Telephone Encounter (Signed)
Staff faxed letter that patient requested to be faxed to his employer. Staff faxed letter to [phone] per pt request and that was the number patient provided to staff before faxing later. Patient is aware later was faxed.

## 2019-02-14 MED FILL — DESCOVY 200-25 MG TABS: 200-25 | 30 days supply | Qty: 30 | Fill #2

## 2019-02-15 ENCOUNTER — Telehealth: Payer: Self-pay | Admitting: Infectious Disease

## 2019-02-15 NOTE — Telephone Encounter (Signed)
COVID-19 Pre-Screening Questions: ° °Do you currently have a fever (>100 °F), chills or unexplained body aches? No  ° °Are you currently experiencing new cough, shortness of breath, sore throat, runny nose? No  °•  °Have you recently travelled outside the state of [place] in the last 14 days? No  °•  °Have you been in contact with someone that is currently pending confirmation of Covid19 testing or has been confirmed to have the Covid19 virus?  No  °

## 2019-02-18 ENCOUNTER — Ambulatory Visit: Payer: Self-pay | Admitting: Pharmacist

## 2019-03-14 ENCOUNTER — Other Ambulatory Visit: Payer: Self-pay | Admitting: Pharmacist

## 2019-03-14 DIAGNOSIS — Z7252 High risk homosexual behavior: Secondary | ICD-10-CM

## 2019-03-14 MED ORDER — EMTRICITABINE-TENOFOVIR AF 200-25 MG PO TABS: 1.0000 | ORAL_TABLET | Freq: Every day | ORAL | 0 refills | Status: DC

## 2019-03-15 MED FILL — DESCOVY 200-25 MG TABS: 200-25 | 30 days supply | Qty: 30 | Fill #0

## 2019-03-21 ENCOUNTER — Ambulatory Visit (INDEPENDENT_AMBULATORY_CARE_PROVIDER_SITE_OTHER): Payer: Self-pay | Admitting: Pharmacist

## 2019-03-21 ENCOUNTER — Other Ambulatory Visit: Payer: Self-pay

## 2019-03-21 DIAGNOSIS — Z7252 High risk homosexual behavior: Secondary | ICD-10-CM
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
## 2019-03-22 LAB — URINE CYTOLOGY ANCILLARY ONLY
Chlamydia: NEGATIVE
Neisseria Gonorrhea: NEGATIVE

## 2019-03-22 LAB — RPR: RPR Ser Ql: NONREACTIVE

## 2019-03-22 LAB — HIV ANTIBODY (ROUTINE TESTING W REFLEX): HIV 1&2 Ab, 4th Generation: NONREACTIVE

## 2019-03-25 ENCOUNTER — Telehealth: Payer: Self-pay | Admitting: Pharmacist

## 2019-03-25 DIAGNOSIS — Z7252 High risk homosexual behavior: Secondary | ICD-10-CM

## 2019-03-25 MED ORDER — DESCOVY 200-25 MG PO TABS: 1.0000 | ORAL_TABLET | Freq: Every day | ORAL | 2 refills | Status: DC

## 2019-03-25 NOTE — Telephone Encounter (Signed)
Called patient to let him know that his HIV antibody was negative.  Will send in 3 more months of Descovy.  

## 2019-04-11 MED FILL — DESCOVY 200-25 MG TABS: 200-25 | 30 days supply | Qty: 30 | Fill #0

## 2019-05-09 MED FILL — DESCOVY 200-25 MG TABS: 200-25 | 30 days supply | Qty: 30 | Fill #1

## 2019-06-03 MED FILL — DESCOVY 200-25 MG TABS: 200-25 | 30 days supply | Qty: 30 | Fill #2

## 2019-06-04 ENCOUNTER — Encounter (HOSPITAL_BASED_OUTPATIENT_CLINIC_OR_DEPARTMENT_OTHER): Payer: Self-pay | Admitting: Emergency Medicine

## 2019-06-04 ENCOUNTER — Other Ambulatory Visit: Payer: Self-pay

## 2019-06-04 ENCOUNTER — Emergency Department (HOSPITAL_BASED_OUTPATIENT_CLINIC_OR_DEPARTMENT_OTHER)
Admission: EM | Admit: 2019-06-04 | Discharge: 2019-06-04 | Disposition: A | Discharge status: Home / Self Care | Payer: Self-pay | Attending: Emergency Medicine | Admitting: Emergency Medicine

## 2019-06-04 ENCOUNTER — Emergency Department (HOSPITAL_BASED_OUTPATIENT_CLINIC_OR_DEPARTMENT_OTHER): Payer: Self-pay

## 2019-06-04 DIAGNOSIS — S60512A Abrasion of left hand, initial encounter: Secondary | ICD-10-CM | POA: Insufficient documentation

## 2019-06-04 DIAGNOSIS — Y998 Other external cause status: Secondary | ICD-10-CM | POA: Insufficient documentation

## 2019-06-04 DIAGNOSIS — W010XXA Fall on same level from slipping, tripping and stumbling without subsequent striking against object, initial encounter: Secondary | ICD-10-CM | POA: Insufficient documentation

## 2019-06-04 DIAGNOSIS — Z79899 Other long term (current) drug therapy: Secondary | ICD-10-CM | POA: Insufficient documentation

## 2019-06-04 DIAGNOSIS — Y9389 Activity, other specified: Secondary | ICD-10-CM | POA: Insufficient documentation

## 2019-06-04 DIAGNOSIS — Z87891 Personal history of nicotine dependence: Secondary | ICD-10-CM | POA: Insufficient documentation

## 2019-06-04 DIAGNOSIS — Y9289 Other specified places as the place of occurrence of the external cause: Secondary | ICD-10-CM | POA: Insufficient documentation

## 2019-06-04 DIAGNOSIS — M25532 Pain in left wrist: Secondary | ICD-10-CM

## 2019-06-04 DIAGNOSIS — I10 Essential (primary) hypertension: Secondary | ICD-10-CM | POA: Insufficient documentation

## 2019-06-04 MED ORDER — SULFAMETHOXAZOLE-TRIMETHOPRIM 800-160 MG PO TABS: 1.0000 | ORAL_TABLET | Freq: Two times a day (BID) | ORAL | 0 refills | Status: AC

## 2019-06-04 MED ORDER — BACITRACIN ZINC 500 UNIT/GM EX OINT
TOPICAL_OINTMENT | Freq: Two times a day (BID) | CUTANEOUS | Status: DC
  Administered 2019-06-04: 18:00:00 via TOPICAL
  Filled 2019-06-04: qty 28.35

## 2019-06-04 NOTE — ED Triage Notes (Signed)
Pt fell while going up brick stairs on Friday. States he injured his wrist. C/o pain, swelling, and "leaking fluid". He has a bandage in place.
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
## 2019-06-04 NOTE — Discharge Instructions (Signed)
You are seen in the ER after a fall for left wrist and hand pain, wound and swelling.  X-rays today did not reveal any fractures, dislocations in your hand or wrist.  I suspect your pain is from the wound and also contusion to the soft tissues of your hand and wrist.  We will give you antibiotic (bactrim) since you reported of drainage, pain.  This will prevent an infection on the wound.  We have placed your wrist and hand in a brace for support, compression.  Wear this brace for comfort and at least for the next 5 to 7 days to help with swelling, pain and to prevent further injury.  For pain and inflammation you can use a combination of ibuprofen and acetaminophen.  Take [phone] mg acetaminophen (tylenol) every 6 hours or 600 mg ibuprofen (advil, motrin) every 6 hours.  You can take these separately or combine them every 6 hours for maximum pain control. Do not exceed 4,000 mg acetaminophen or 2,400 mg ibuprofen in a 24 hour period.  Do not take ibuprofen containing products if you have history of kidney disease, ulcers, GI bleeding, severe acid reflux, or take a blood thinner.  Do not take acetaminophen if you have liver disease.

## 2019-06-25 ENCOUNTER — Ambulatory Visit: Payer: Self-pay | Admitting: Pharmacist

## 2019-07-24 ENCOUNTER — Other Ambulatory Visit: Payer: Self-pay | Admitting: Pharmacist

## 2019-07-24 DIAGNOSIS — Z7252 High risk homosexual behavior: Secondary | ICD-10-CM

## 2019-07-24 MED ORDER — DESCOVY 200-25 MG PO TABS: 1.0000 | ORAL_TABLET | Freq: Every day | ORAL | 0 refills | Status: DC

## 2019-07-24 MED FILL — DESCOVY 200-25 MG TABS: 200-25 | 30 days supply | Qty: 30 | Fill #0

## 2019-07-24 NOTE — Progress Notes (Signed)
Sending in a 30 day supply of Descovy for patient. He will come for labs tomorrow.

## 2019-07-25 ENCOUNTER — Other Ambulatory Visit: Payer: Self-pay

## 2019-07-25 ENCOUNTER — Ambulatory Visit (INDEPENDENT_AMBULATORY_CARE_PROVIDER_SITE_OTHER): Payer: Self-pay | Admitting: Pharmacist

## 2019-07-25 DIAGNOSIS — Z7252 High risk homosexual behavior: Secondary | ICD-10-CM
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
## 2019-07-26 ENCOUNTER — Other Ambulatory Visit: Payer: Self-pay | Admitting: Pharmacist

## 2019-07-26 DIAGNOSIS — Z7252 High risk homosexual behavior: Secondary | ICD-10-CM

## 2019-07-26 LAB — HIV ANTIBODY (ROUTINE TESTING W REFLEX): HIV 1&2 Ab, 4th Generation: NONREACTIVE

## 2019-07-26 LAB — RPR: RPR Ser Ql: NONREACTIVE

## 2019-07-26 LAB — BASIC METABOLIC PANEL
BUN: 16 mg/dL (ref 7–25)
CO2: 20 mmol/L (ref 20–32)
Calcium: 9.3 mg/dL (ref 8.6–10.3)
Chloride: 106 mmol/L (ref 98–110)
Creat: 1.03 mg/dL (ref 0.60–1.35)
Glucose, Bld: 88 mg/dL (ref 65–99)
Potassium: 3.9 mmol/L (ref 3.5–5.3)
Sodium: 140 mmol/L (ref 135–146)

## 2019-07-26 MED ORDER — DESCOVY 200-25 MG PO TABS: 1.0000 | ORAL_TABLET | Freq: Every day | ORAL | 1 refills | Status: DC

## 2019-07-26 NOTE — Progress Notes (Signed)
Patient's HIV antibody is negative.  Will send in 2 more months of Descovy to [facility]. Patient already received a 30 day supply this week.

## 2019-07-30 LAB — URINE CYTOLOGY ANCILLARY ONLY
Chlamydia: NEGATIVE
Comment: NEGATIVE
Comment: NORMAL
Neisseria Gonorrhea: NEGATIVE

## 2019-08-20 MED FILL — DESCOVY 200-25 MG TABS: 200-25 | 30 days supply | Qty: 30 | Fill #0

## 2019-09-19 MED FILL — DESCOVY 200-25 MG TABS: 200-25 | 30 days supply | Qty: 30 | Fill #1

## 2019-10-21 ENCOUNTER — Other Ambulatory Visit: Payer: Self-pay

## 2019-10-21 ENCOUNTER — Encounter: Payer: Self-pay | Admitting: Pharmacist

## 2019-10-22 ENCOUNTER — Other Ambulatory Visit: Payer: Self-pay

## 2019-10-22 ENCOUNTER — Ambulatory Visit: Payer: Self-pay | Admitting: Pharmacist

## 2019-10-22 ENCOUNTER — Other Ambulatory Visit: Payer: Self-pay | Admitting: Pharmacist

## 2019-10-22 DIAGNOSIS — Z7252 High risk homosexual behavior: Secondary | ICD-10-CM

## 2019-10-22 DIAGNOSIS — Z79899 Other long term (current) drug therapy: Secondary | ICD-10-CM

## 2019-10-23 ENCOUNTER — Other Ambulatory Visit: Payer: Self-pay

## 2019-10-23 ENCOUNTER — Ambulatory Visit: Payer: Self-pay | Admitting: Pharmacist

## 2019-10-28 ENCOUNTER — Telehealth: Payer: Self-pay | Admitting: Pharmacy Technician

## 2019-10-28 NOTE — Telephone Encounter (Signed)
[facility] Patient Advocate Encounter  Completed and sent [employer] Advancing Access application for Descovy for this patient who is uninsured.    Patient is approved 10/28/2019 through 10/27/2020.  BIN      [id number] PCN    [id number] GRP    [id number] ID        [id number]   Kathie Rhodes E. [name] CPhT Specialty Pharmacy Patient [facility] Phone: [phone] Fax:  [phone]

## 2019-11-13 ENCOUNTER — Other Ambulatory Visit: Payer: Self-pay

## 2019-11-13 ENCOUNTER — Telehealth: Payer: Self-pay

## 2019-11-13 DIAGNOSIS — Z7252 High risk homosexual behavior: Secondary | ICD-10-CM

## 2019-11-13 DIAGNOSIS — Z79899 Other long term (current) drug therapy: Secondary | ICD-10-CM

## 2019-11-13 NOTE — Telephone Encounter (Signed)
Patient called office today requesting appointment for prep clinic and letter for work. Patient states he spoke with someone in pharmacy for both. States that he was told letter would show up on mychart, but nothing was showing yet. Employer is requesting patient e-mail letter as soon as possible.  Unable to write letter until patient is seen in office. Has multiple no shows. Pharmacy team is aware CMA will write letter for today's visit. Lorenso Courier, [place]

## 2019-11-14 ENCOUNTER — Telehealth: Payer: Self-pay | Admitting: Pharmacist

## 2019-11-14 ENCOUNTER — Telehealth: Payer: Self-pay

## 2019-11-14 ENCOUNTER — Encounter: Payer: Self-pay | Admitting: Pharmacist

## 2019-11-14 DIAGNOSIS — Z7252 High risk homosexual behavior: Secondary | ICD-10-CM

## 2019-11-14 LAB — BASIC METABOLIC PANEL
BUN: 14 mg/dL (ref 7–25)
CO2: 28 mmol/L (ref 20–32)
Calcium: 9.6 mg/dL (ref 8.6–10.3)
Chloride: 102 mmol/L (ref 98–110)
Creat: 1.08 mg/dL (ref 0.60–1.35)
Glucose, Bld: 94 mg/dL (ref 65–99)
Potassium: 3.8 mmol/L (ref 3.5–5.3)
Sodium: 138 mmol/L (ref 135–146)

## 2019-11-14 LAB — RPR: RPR Ser Ql: NONREACTIVE

## 2019-11-14 LAB — HIV ANTIBODY (ROUTINE TESTING W REFLEX): HIV 1&2 Ab, 4th Generation: NONREACTIVE

## 2019-11-14 MED ORDER — DESCOVY 200-25 MG PO TABS: 1.0000 | ORAL_TABLET | Freq: Every day | ORAL | 2 refills | Status: DC

## 2019-11-14 MED FILL — DESCOVY 200-25 MG TABS: 200-25 | 30 days supply | Qty: 30 | Fill #0

## 2019-11-14 NOTE — Telephone Encounter (Signed)
Medication has been sent to [facility], in review status to be picked up today. Patient was advised by Cassie of the scheduling with labs and medication timeline.

## 2019-11-14 NOTE — Telephone Encounter (Signed)
Patient called office today frustrated demanding office send in medication to pharmacy. Is currently waiting at the pharmacy for medication. States that if medication is not sent before 10 am he will come to the office to speak with pharmacy staff.  Patient refused to leave voicemail requested CMA send message to [place], Pharmacist. Lorenso Courier, CMA
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
## 2019-11-15 LAB — URINE CYTOLOGY ANCILLARY ONLY
Chlamydia: NEGATIVE
Comment: NEGATIVE
Comment: NORMAL
Neisseria Gonorrhea: NEGATIVE

## 2019-12-09 MED FILL — DESCOVY 200-25 MG TABS: 200-25 | 30 days supply | Qty: 30 | Fill #1

## 2020-01-10 MED FILL — DESCOVY 200-25 MG TABS: 200-25 | 30 days supply | Qty: 30 | Fill #2

## 2020-02-18 ENCOUNTER — Ambulatory Visit: Payer: Self-pay | Admitting: Pharmacist

## 2020-03-10 ENCOUNTER — Other Ambulatory Visit: Payer: Self-pay

## 2020-03-10 ENCOUNTER — Inpatient Hospital Stay (HOSPITAL_COMMUNITY): Admission: RE | Admit: 2020-03-10 | Payer: Self-pay | Source: Ambulatory Visit

## 2020-03-10 ENCOUNTER — Ambulatory Visit (INDEPENDENT_AMBULATORY_CARE_PROVIDER_SITE_OTHER): Payer: Self-pay | Admitting: Pharmacist

## 2020-03-10 DIAGNOSIS — Z79899 Other long term (current) drug therapy: Secondary | ICD-10-CM

## 2020-03-10 DIAGNOSIS — Z7252 High risk homosexual behavior: Secondary | ICD-10-CM

## 2020-03-10 MED ORDER — DESCOVY 200-25 MG PO TABS: 1.0000 | ORAL_TABLET | Freq: Every day | ORAL | 2 refills | Status: DC

## 2020-03-10 MED FILL — DESCOVY 200-25 MG TABS: 200-25 | 30 days supply | Qty: 30 | Fill #0
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
## 2020-03-11 ENCOUNTER — Encounter: Payer: Self-pay | Admitting: Pharmacist

## 2020-03-11 LAB — RPR: RPR Ser Ql: NONREACTIVE

## 2020-03-11 LAB — HIV ANTIBODY (ROUTINE TESTING W REFLEX): HIV 1&2 Ab, 4th Generation: NONREACTIVE

## 2020-03-12 LAB — URINE CYTOLOGY ANCILLARY ONLY
Chlamydia: NEGATIVE
Comment: NEGATIVE
Comment: NORMAL
Neisseria Gonorrhea: NEGATIVE

## 2020-04-01 MED FILL — DESCOVY 200-25 MG TABS: 200-25 | 30 days supply | Qty: 30 | Fill #1

## 2020-04-30 MED FILL — DESCOVY 200-25 MG TABS: 200-25 | 30 days supply | Qty: 30 | Fill #2

## 2020-05-27 ENCOUNTER — Other Ambulatory Visit: Payer: Self-pay | Admitting: Pharmacist

## 2020-05-27 DIAGNOSIS — Z7252 High risk homosexual behavior: Secondary | ICD-10-CM

## 2020-06-29 ENCOUNTER — Other Ambulatory Visit: Payer: Self-pay | Admitting: Pharmacist

## 2020-06-29 ENCOUNTER — Telehealth: Payer: Self-pay | Admitting: *Deleted

## 2020-06-29 DIAGNOSIS — Z7252 High risk homosexual behavior: Secondary | ICD-10-CM

## 2020-06-29 MED ORDER — DESCOVY 200-25 MG PO TABS: 1.0000 | ORAL_TABLET | Freq: Every day | ORAL | 0 refills | Status: DC

## 2020-06-29 MED FILL — DESCOVY 200-25 MG TABS: 200-25 | 30 days supply | Qty: 30 | Fill #0

## 2020-06-29 NOTE — Telephone Encounter (Signed)
Patient called for appointment and refills. He takes descovy for prep.  He needs a refill of descovy, wants to come on 9/30 for appointment/labs. Cassie unavailable that day, patient still wants to come, ok to see someone else.  RN unable to handle either request, sending to [facility] for advice. Andree Coss, RN

## 2020-06-29 NOTE — Telephone Encounter (Signed)
Per Wilford Sports, RN scheduled patient for lab appointment 9/30.  Cassie sent in 30 day refill.  Patient knows he needs to keep appointment for future refills. Andree Coss, RN

## 2020-06-29 NOTE — Telephone Encounter (Signed)
Thanks Michelle

## 2020-07-06 ENCOUNTER — Other Ambulatory Visit: Payer: Self-pay

## 2020-07-06 DIAGNOSIS — Z7252 High risk homosexual behavior: Secondary | ICD-10-CM

## 2020-07-07 ENCOUNTER — Encounter: Payer: Self-pay | Admitting: Pharmacist

## 2020-07-07 ENCOUNTER — Other Ambulatory Visit (HOSPITAL_COMMUNITY): Payer: Self-pay | Admitting: Pharmacist

## 2020-07-07 ENCOUNTER — Other Ambulatory Visit: Payer: Self-pay | Admitting: Pharmacist

## 2020-07-07 DIAGNOSIS — Z7252 High risk homosexual behavior: Secondary | ICD-10-CM

## 2020-07-07 LAB — BASIC METABOLIC PANEL
BUN: 14 mg/dL (ref 7–25)
CO2: 29 mmol/L (ref 20–32)
Calcium: 9.5 mg/dL (ref 8.6–10.3)
Chloride: 102 mmol/L (ref 98–110)
Creat: 1.1 mg/dL (ref 0.60–1.35)
Glucose, Bld: 82 mg/dL (ref 65–99)
Potassium: 4.5 mmol/L (ref 3.5–5.3)
Sodium: 138 mmol/L (ref 135–146)

## 2020-07-07 LAB — URINE CYTOLOGY ANCILLARY ONLY
Chlamydia: NEGATIVE
Comment: NEGATIVE
Comment: NORMAL
Neisseria Gonorrhea: NEGATIVE

## 2020-07-07 LAB — RPR: RPR Ser Ql: NONREACTIVE

## 2020-07-07 LAB — HIV ANTIBODY (ROUTINE TESTING W REFLEX): HIV 1&2 Ab, 4th Generation: NONREACTIVE

## 2020-07-07 MED ORDER — DESCOVY 200-25 MG PO TABS: 1.0000 | ORAL_TABLET | Freq: Every day | ORAL | 2 refills | Status: DC

## 2020-07-07 NOTE — Progress Notes (Signed)
Patient's HIV antibody is negative.  Will send in 3 more months of Descovy to [facility].  

## 2020-07-09 ENCOUNTER — Other Ambulatory Visit: Payer: Self-pay

## 2020-07-23 MED FILL — DESCOVY 200-25 MG TABS: 200-25 | 30 days supply | Qty: 30 | Fill #0

## 2020-08-19 MED FILL — DESCOVY 200-25 MG TABS: 200-25 | 30 days supply | Qty: 30 | Fill #1

## 2020-09-14 MED FILL — DESCOVY 200-25 MG TABS: 200-25 | 30 days supply | Qty: 30 | Fill #2

## 2020-09-16 ENCOUNTER — Ambulatory Visit (INDEPENDENT_AMBULATORY_CARE_PROVIDER_SITE_OTHER): Payer: Self-pay | Admitting: Pharmacist

## 2020-09-16 ENCOUNTER — Other Ambulatory Visit: Payer: Self-pay

## 2020-09-16 DIAGNOSIS — Z79899 Other long term (current) drug therapy: Secondary | ICD-10-CM

## 2020-09-16 DIAGNOSIS — Z7252 High risk homosexual behavior: Secondary | ICD-10-CM
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
## 2020-09-17 ENCOUNTER — Other Ambulatory Visit (HOSPITAL_COMMUNITY): Payer: Self-pay | Admitting: Internal Medicine

## 2020-09-17 ENCOUNTER — Other Ambulatory Visit: Payer: Self-pay | Admitting: Pharmacist

## 2020-09-17 ENCOUNTER — Encounter: Payer: Self-pay | Admitting: Pharmacist

## 2020-09-17 DIAGNOSIS — Z7252 High risk homosexual behavior: Secondary | ICD-10-CM

## 2020-09-17 LAB — URINE CYTOLOGY ANCILLARY ONLY
Chlamydia: NEGATIVE
Comment: NEGATIVE
Comment: NORMAL
Neisseria Gonorrhea: NEGATIVE

## 2020-09-17 LAB — HIV ANTIBODY (ROUTINE TESTING W REFLEX): HIV 1&2 Ab, 4th Generation: NONREACTIVE

## 2020-09-17 LAB — RPR: RPR Ser Ql: NONREACTIVE

## 2020-09-17 MED ORDER — DESCOVY 200-25 MG PO TABS: 1.0000 | ORAL_TABLET | Freq: Every day | ORAL | 5 refills | Status: DC

## 2020-09-17 NOTE — Progress Notes (Signed)
Patient's HIV antibody is negative.  Will send in 3 more months of Descovy to [facility]. Will send in enough refills to last until his next appointment with me in June. He will come in March for lab work.

## 2020-09-28 ENCOUNTER — Ambulatory Visit: Payer: Self-pay | Admitting: Pharmacist

## 2020-10-08 MED FILL — DESCOVY 200-25 MG TABS: 200-25 | 30 days supply | Qty: 30 | Fill #0

## 2020-11-08 ENCOUNTER — Emergency Department (HOSPITAL_BASED_OUTPATIENT_CLINIC_OR_DEPARTMENT_OTHER)
Admission: EM | Admit: 2020-11-08 | Discharge: 2020-11-08 | Disposition: A | Discharge status: Home / Self Care | Payer: Self-pay | Attending: Emergency Medicine | Admitting: Emergency Medicine

## 2020-11-08 ENCOUNTER — Emergency Department (HOSPITAL_BASED_OUTPATIENT_CLINIC_OR_DEPARTMENT_OTHER): Payer: Self-pay

## 2020-11-08 DIAGNOSIS — I10 Essential (primary) hypertension: Secondary | ICD-10-CM | POA: Insufficient documentation

## 2020-11-08 DIAGNOSIS — M5441 Lumbago with sciatica, right side: Secondary | ICD-10-CM

## 2020-11-08 DIAGNOSIS — Z87891 Personal history of nicotine dependence: Secondary | ICD-10-CM | POA: Insufficient documentation

## 2020-11-08 MED ORDER — KETOROLAC TROMETHAMINE 30 MG/ML IJ SOLN
30.0000 mg | Freq: Once | INTRAMUSCULAR | Status: AC
  Administered 2020-11-08: 30 mg via INTRAVENOUS
  Filled 2020-11-08: qty 1

## 2020-11-08 MED ORDER — ACETAMINOPHEN 500 MG PO TABS
1000.0000 mg | ORAL_TABLET | Freq: Once | ORAL | Status: AC
  Administered 2020-11-08: 1000 mg via ORAL
  Filled 2020-11-08: qty 2

## 2020-11-08 MED ORDER — LIDOCAINE 5 % EX PTCH: 1.0000 | MEDICATED_PATCH | CUTANEOUS | 0 refills | Status: DC

## 2020-11-08 MED ORDER — HYDROCODONE-ACETAMINOPHEN 5-325 MG PO TABS: 1.0000 | ORAL_TABLET | Freq: Four times a day (QID) | ORAL | 0 refills | Status: DC | PRN

## 2020-11-08 MED ORDER — CYCLOBENZAPRINE HCL 10 MG PO TABS: 10.0000 mg | ORAL_TABLET | Freq: Two times a day (BID) | ORAL | 0 refills | Status: DC | PRN

## 2020-11-08 MED ORDER — HYDROMORPHONE HCL 1 MG/ML IJ SOLN
1.0000 mg | Freq: Once | INTRAMUSCULAR | Status: AC
  Administered 2020-11-08: 1 mg via INTRAVENOUS
  Filled 2020-11-08: qty 1

## 2020-11-08 MED ORDER — LIDOCAINE 5 % EX PTCH: 1.0000 | MEDICATED_PATCH | CUTANEOUS | Status: DC

## 2020-11-08 MED ORDER — LIDOCAINE 5 % EX PTCH
MEDICATED_PATCH | CUTANEOUS | Status: AC
  Administered 2020-11-08: 1 via TRANSDERMAL
  Filled 2020-11-08: qty 1

## 2020-11-08 MED ORDER — PREDNISONE 50 MG PO TABS
60.0000 mg | ORAL_TABLET | Freq: Once | ORAL | Status: AC
  Administered 2020-11-08: 60 mg via ORAL
  Filled 2020-11-08: qty 1

## 2020-11-08 MED ORDER — PREDNISONE 50 MG PO TABS: 50.0000 mg | ORAL_TABLET | Freq: Every day | ORAL | 0 refills | Status: AC

## 2020-11-08 MED ORDER — CYCLOBENZAPRINE HCL 5 MG PO TABS
5.0000 mg | ORAL_TABLET | Freq: Once | ORAL | Status: AC
  Administered 2020-11-08: 5 mg via ORAL
  Filled 2020-11-08: qty 1

## 2020-11-08 NOTE — ED Notes (Signed)
Patient transported to X-ray 

## 2020-11-08 NOTE — Discharge Instructions (Addendum)
Take the medications as prescribed  Return for new or worsening symptoms 

## 2020-11-08 NOTE — ED Notes (Signed)
Pt ambulated down the hallway with no assistance. Pt stated " he had a little bit of pain while ambulating".

## 2020-11-08 NOTE — ED Notes (Signed)
ED Provider at bedside. 

## 2020-11-08 NOTE — ED Triage Notes (Signed)
Arrived via [facility], with back pain x 2 days. Hx of same. Given 100mg  Fentanyl , BP 160/110, P 70
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
## 2020-11-16 ENCOUNTER — Telehealth: Payer: Self-pay

## 2020-11-16 NOTE — Telephone Encounter (Signed)
[facility] Patient Advocate Encounter  Completed and sent [employer] Advancing Access application for Descovy for this patient who is uninsured.    Patient assistance phone number for follow up is [phone].   This encounter will be updated until final determination.,   Lakelyn Straus, CPhT Specialty Pharmacy Patient [facility] Phone: [phone] Fax:  [phone]  

## 2020-11-17 ENCOUNTER — Telehealth: Payer: Self-pay

## 2020-11-17 MED FILL — DESCOVY 200-25 MG TABS: 200-25 | 30 days supply | Qty: 30 | Fill #1

## 2020-11-17 NOTE — Telephone Encounter (Signed)
[facility] Patient Advocate Encounter  Completed and sent [employer] Advancing Access application for Descovy for this patient who is uninsured.    Patient is approved 11/17/20 through 10/09/21.  BIN      [id number] PCN    [id number] [id number]    [id number] ID        [id number]   Clearance Coots, CPhT Specialty Pharmacy Patient [facility] Phone: [phone] Fax:  [phone]

## 2020-12-11 MED FILL — DESCOVY 200-25 MG TABS: 200-25 | 30 days supply | Qty: 30 | Fill #1

## 2020-12-17 ENCOUNTER — Other Ambulatory Visit: Payer: Self-pay

## 2020-12-17 ENCOUNTER — Other Ambulatory Visit: Payer: Self-pay | Admitting: Pharmacist

## 2020-12-17 DIAGNOSIS — Z79899 Other long term (current) drug therapy: Secondary | ICD-10-CM

## 2020-12-18 ENCOUNTER — Emergency Department (HOSPITAL_BASED_OUTPATIENT_CLINIC_OR_DEPARTMENT_OTHER)
Admission: EM | Admit: 2020-12-18 | Discharge: 2020-12-18 | Disposition: A | Discharge status: Home / Self Care | Payer: Self-pay | Attending: Emergency Medicine | Admitting: Emergency Medicine

## 2020-12-18 ENCOUNTER — Encounter (HOSPITAL_BASED_OUTPATIENT_CLINIC_OR_DEPARTMENT_OTHER): Payer: Self-pay | Admitting: Emergency Medicine

## 2020-12-18 ENCOUNTER — Emergency Department (HOSPITAL_BASED_OUTPATIENT_CLINIC_OR_DEPARTMENT_OTHER): Payer: Self-pay

## 2020-12-18 ENCOUNTER — Other Ambulatory Visit: Payer: Self-pay

## 2020-12-18 DIAGNOSIS — I10 Essential (primary) hypertension: Secondary | ICD-10-CM | POA: Insufficient documentation

## 2020-12-18 DIAGNOSIS — G8929 Other chronic pain: Secondary | ICD-10-CM | POA: Insufficient documentation

## 2020-12-18 DIAGNOSIS — Z87891 Personal history of nicotine dependence: Secondary | ICD-10-CM | POA: Insufficient documentation

## 2020-12-18 DIAGNOSIS — M5126 Other intervertebral disc displacement, lumbar region: Secondary | ICD-10-CM

## 2020-12-18 DIAGNOSIS — M545 Low back pain, unspecified: Secondary | ICD-10-CM | POA: Insufficient documentation

## 2020-12-18 LAB — BASIC METABOLIC PANEL
BUN: 10 mg/dL (ref 7–25)
CO2: 24 mmol/L (ref 20–32)
Calcium: 8.9 mg/dL (ref 8.6–10.3)
Chloride: 104 mmol/L (ref 98–110)
Creat: 1.1 mg/dL (ref 0.60–1.35)
Glucose, Bld: 134 mg/dL — ABNORMAL HIGH (ref 65–99)
Potassium: 3.9 mmol/L (ref 3.5–5.3)
Sodium: 139 mmol/L (ref 135–146)

## 2020-12-18 LAB — URINE CYTOLOGY ANCILLARY ONLY
Chlamydia: NEGATIVE
Comment: NEGATIVE
Comment: NORMAL
Neisseria Gonorrhea: NEGATIVE

## 2020-12-18 LAB — RPR: RPR Ser Ql: NONREACTIVE

## 2020-12-18 LAB — HIV ANTIBODY (ROUTINE TESTING W REFLEX): HIV 1&2 Ab, 4th Generation: NONREACTIVE

## 2020-12-18 MED ORDER — CYCLOBENZAPRINE HCL 5 MG PO TABS: 5.0000 mg | ORAL_TABLET | Freq: Two times a day (BID) | ORAL | 0 refills | Status: AC | PRN

## 2020-12-18 MED ORDER — PREDNISONE 50 MG PO TABS
60.0000 mg | ORAL_TABLET | Freq: Once | ORAL | Status: AC
  Administered 2020-12-18: 60 mg via ORAL
  Filled 2020-12-18: qty 1

## 2020-12-18 MED ORDER — HYDROMORPHONE HCL 1 MG/ML IJ SOLN
2.0000 mg | Freq: Once | INTRAMUSCULAR | Status: AC
  Administered 2020-12-18: 2 mg via INTRAVENOUS
  Filled 2020-12-18: qty 2

## 2020-12-18 MED ORDER — ONDANSETRON 4 MG PO TBDP
4.0000 mg | ORAL_TABLET | Freq: Once | ORAL | Status: AC
  Administered 2020-12-18: 4 mg via ORAL
  Filled 2020-12-18: qty 1

## 2020-12-18 MED ORDER — PREDNISONE 10 MG PO TABS: 20.0000 mg | ORAL_TABLET | Freq: Every day | ORAL | 0 refills | Status: DC

## 2020-12-18 MED ORDER — LIDOCAINE 5 % EX PTCH: 1.0000 | MEDICATED_PATCH | CUTANEOUS | 0 refills | Status: DC

## 2020-12-18 MED ORDER — HYDROCODONE-ACETAMINOPHEN 5-325 MG PO TABS
1.5000 | ORAL_TABLET | Freq: Once | ORAL | Status: AC
  Administered 2020-12-18: 1.5 via ORAL
  Filled 2020-12-18: qty 2

## 2020-12-18 MED ORDER — HYDROCODONE-ACETAMINOPHEN 7.5-325 MG PO TABS: 1.0000 | ORAL_TABLET | Freq: Four times a day (QID) | ORAL | 0 refills | Status: AC | PRN

## 2020-12-18 MED ORDER — CYCLOBENZAPRINE HCL 5 MG PO TABS
5.0000 mg | ORAL_TABLET | Freq: Once | ORAL | Status: AC
  Administered 2020-12-18: 5 mg via ORAL
  Filled 2020-12-18: qty 1

## 2020-12-18 MED ORDER — LIDOCAINE 5 % EX PTCH
1.0000 | MEDICATED_PATCH | CUTANEOUS | Status: DC
  Administered 2020-12-18: 1 via TRANSDERMAL
  Filled 2020-12-18: qty 1
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
## 2020-12-18 NOTE — ED Notes (Signed)
Patient transported to CT 

## 2020-12-18 NOTE — ED Triage Notes (Signed)
Mid lower back pain radiates to right leg, feels shocking sensation down both legs. No dysuria, some difficulty ambulation.  History of same, this episode started at 4 am.

## 2020-12-18 NOTE — Discharge Instructions (Addendum)
You have been seen and discharged from the emergency department.  You have been diagnosed with lumbar disc herniations.  You have been prescribed a muscle relaxer, pain medicine, steroid and pain patch.  Do not take a dose of pain medicine and the muscle relaxer at the same time do not mix the pain medicine and muscle relaxer with alcohol or other sedating medications. Do not drive or do heavy physical activity and to know how these medication affects you.  They may cause drowsiness. Follow-up with your primary provider for reevaluation. Take home medications as prescribed. If you have any worsening symptoms or further concerns for health please return to an emergency department for further evaluation.

## 2020-12-21 ENCOUNTER — Other Ambulatory Visit: Payer: Self-pay

## 2021-01-04 MED FILL — DESCOVY 200-25 MG TABS: 200-25 | 30 days supply | Qty: 30 | Fill #2

## 2021-01-05 ENCOUNTER — Other Ambulatory Visit (HOSPITAL_COMMUNITY): Payer: Self-pay

## 2021-01-28 ENCOUNTER — Encounter: Payer: Self-pay | Admitting: Pharmacist

## 2021-01-29 ENCOUNTER — Other Ambulatory Visit (HOSPITAL_COMMUNITY): Payer: Self-pay

## 2021-01-29 MED FILL — Emtricitabine-Tenofovir Alafenamide Fumarate Tab 200-25 MG: ORAL | 30 days supply | Qty: 30 | Fill #0 | Status: AC

## 2021-02-26 ENCOUNTER — Other Ambulatory Visit (HOSPITAL_COMMUNITY): Payer: Self-pay

## 2021-02-26 MED FILL — Emtricitabine-Tenofovir Alafenamide Fumarate Tab 200-25 MG: ORAL | 30 days supply | Qty: 30 | Fill #1 | Status: CN

## 2021-03-09 ENCOUNTER — Other Ambulatory Visit (HOSPITAL_COMMUNITY): Payer: Self-pay

## 2021-03-11 ENCOUNTER — Other Ambulatory Visit (HOSPITAL_COMMUNITY): Payer: Self-pay

## 2021-03-11 MED FILL — Emtricitabine-Tenofovir Alafenamide Fumarate Tab 200-25 MG: ORAL | 30 days supply | Qty: 30 | Fill #1 | Status: AC

## 2021-03-23 ENCOUNTER — Other Ambulatory Visit: Payer: Self-pay | Admitting: Pharmacist

## 2021-03-23 ENCOUNTER — Ambulatory Visit: Payer: Self-pay | Admitting: Pharmacist

## 2021-03-23 ENCOUNTER — Other Ambulatory Visit: Payer: Self-pay

## 2021-03-23 DIAGNOSIS — Z79899 Other long term (current) drug therapy: Secondary | ICD-10-CM

## 2021-03-23 DIAGNOSIS — Z113 Encounter for screening for infections with a predominantly sexual mode of transmission: Secondary | ICD-10-CM

## 2021-03-24 ENCOUNTER — Other Ambulatory Visit: Payer: Self-pay

## 2021-03-24 ENCOUNTER — Telehealth (INDEPENDENT_AMBULATORY_CARE_PROVIDER_SITE_OTHER): Payer: Self-pay | Admitting: Pharmacist

## 2021-03-24 ENCOUNTER — Other Ambulatory Visit (HOSPITAL_COMMUNITY): Payer: Self-pay

## 2021-03-24 ENCOUNTER — Other Ambulatory Visit: Payer: Self-pay | Admitting: Pharmacist

## 2021-03-24 DIAGNOSIS — Z79899 Other long term (current) drug therapy: Secondary | ICD-10-CM

## 2021-03-24 DIAGNOSIS — Z7252 High risk homosexual behavior: Secondary | ICD-10-CM

## 2021-03-24 LAB — RPR: RPR Ser Ql: NONREACTIVE

## 2021-03-24 LAB — HIV ANTIBODY (ROUTINE TESTING W REFLEX): HIV 1&2 Ab, 4th Generation: NONREACTIVE

## 2021-03-24 MED ORDER — DESCOVY 200-25 MG PO TABS
1.0000 | ORAL_TABLET | Freq: Every day | ORAL | 2 refills | Status: DC
  Filled 2021-03-24 – 2021-04-05 (×2): qty 30, 30d supply, fill #0
  Filled 2021-04-27: qty 30, 30d supply, fill #1
  Filled 2021-05-27: qty 30, 30d supply, fill #2

## 2021-03-24 NOTE — Progress Notes (Signed)
Patient did not answer. HIV antibody negative from lab draw yesterday. Will send in Descovy refills.  Marlie Kuennen L. Manar Smalling, PharmD, BCIDP, AAHIVP, CPP Clinical Pharmacist Practitioner Infectious Diseases Clinical Pharmacist [facility] 03/24/2021, 3:53 PM

## 2021-03-25 LAB — URINE CYTOLOGY ANCILLARY ONLY
Chlamydia: NEGATIVE
Comment: NEGATIVE
Comment: NORMAL
Neisseria Gonorrhea: NEGATIVE

## 2021-04-01 ENCOUNTER — Other Ambulatory Visit (HOSPITAL_COMMUNITY): Payer: Self-pay

## 2021-04-05 ENCOUNTER — Other Ambulatory Visit (HOSPITAL_COMMUNITY): Payer: Self-pay

## 2021-04-12 ENCOUNTER — Emergency Department (HOSPITAL_BASED_OUTPATIENT_CLINIC_OR_DEPARTMENT_OTHER)
Admission: EM | Admit: 2021-04-12 | Discharge: 2021-04-12 | Disposition: A | Discharge status: Home / Self Care | Payer: Self-pay | Attending: Emergency Medicine | Admitting: Emergency Medicine

## 2021-04-12 ENCOUNTER — Emergency Department (HOSPITAL_BASED_OUTPATIENT_CLINIC_OR_DEPARTMENT_OTHER): Payer: Self-pay

## 2021-04-12 ENCOUNTER — Encounter (HOSPITAL_BASED_OUTPATIENT_CLINIC_OR_DEPARTMENT_OTHER): Payer: Self-pay | Admitting: Emergency Medicine

## 2021-04-12 ENCOUNTER — Other Ambulatory Visit: Payer: Self-pay

## 2021-04-12 ENCOUNTER — Encounter: Payer: Self-pay | Admitting: Emergency Medicine

## 2021-04-12 DIAGNOSIS — Z87891 Personal history of nicotine dependence: Secondary | ICD-10-CM | POA: Insufficient documentation

## 2021-04-12 DIAGNOSIS — I10 Essential (primary) hypertension: Secondary | ICD-10-CM | POA: Insufficient documentation

## 2021-04-12 DIAGNOSIS — A6000 Herpesviral infection of urogenital system, unspecified: Secondary | ICD-10-CM | POA: Insufficient documentation

## 2021-04-12 DIAGNOSIS — S8012XA Contusion of left lower leg, initial encounter: Secondary | ICD-10-CM | POA: Insufficient documentation

## 2021-04-12 DIAGNOSIS — M25571 Pain in right ankle and joints of right foot: Secondary | ICD-10-CM | POA: Insufficient documentation

## 2021-04-12 DIAGNOSIS — Y9302 Activity, running: Secondary | ICD-10-CM | POA: Insufficient documentation

## 2021-04-12 DIAGNOSIS — W228XXA Striking against or struck by other objects, initial encounter: Secondary | ICD-10-CM | POA: Insufficient documentation

## 2021-04-12 DIAGNOSIS — T148XXA Other injury of unspecified body region, initial encounter: Secondary | ICD-10-CM

## 2021-04-12 DIAGNOSIS — S93492A Sprain of other ligament of left ankle, initial encounter: Secondary | ICD-10-CM

## 2021-04-12 MED ORDER — KETOROLAC TROMETHAMINE 30 MG/ML IJ SOLN
30.0000 mg | Freq: Once | INTRAMUSCULAR | Status: AC
  Administered 2021-04-12: 16:00:00 30 mg via INTRAMUSCULAR
  Filled 2021-04-12: qty 1

## 2021-04-12 NOTE — ED Notes (Signed)
Patient transported to X-ray 

## 2021-04-12 NOTE — Discharge Instructions (Addendum)
You were seen in the ER today for your ankle and calf pain.  Your x-rays were reassuring, there are no broken bones or dislocations.  You have an ankle sprain.  You have been placed in a supportive brace and provided with crutches.  Regarding calf pain, you have a hematoma, which is a collection of blood underneath of the skin after your injury yesterday.  Please rest the leg, elevate it, apply ice, and Ace bandage as needed.  May alternate Tylenol and ibuprofen every 3 hours.  May follow-up with your primary care doctor.  Return to the ER for develop any new numbness, tingling, weakness in your leg, or any other new serious symptoms.
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
## 2021-04-27 ENCOUNTER — Other Ambulatory Visit (HOSPITAL_COMMUNITY): Payer: Self-pay

## 2021-05-27 ENCOUNTER — Other Ambulatory Visit (HOSPITAL_COMMUNITY): Payer: Self-pay

## 2021-06-10 ENCOUNTER — Encounter: Payer: Self-pay | Admitting: Pharmacist

## 2021-06-21 ENCOUNTER — Other Ambulatory Visit: Payer: Self-pay

## 2021-06-21 ENCOUNTER — Ambulatory Visit (INDEPENDENT_AMBULATORY_CARE_PROVIDER_SITE_OTHER): Payer: Self-pay | Admitting: Pharmacist

## 2021-06-21 ENCOUNTER — Other Ambulatory Visit (HOSPITAL_COMMUNITY): Payer: Self-pay

## 2021-06-21 DIAGNOSIS — Z113 Encounter for screening for infections with a predominantly sexual mode of transmission: Secondary | ICD-10-CM

## 2021-06-21 DIAGNOSIS — Z7252 High risk homosexual behavior: Secondary | ICD-10-CM

## 2021-06-21 DIAGNOSIS — Z79899 Other long term (current) drug therapy: Secondary | ICD-10-CM

## 2021-06-21 MED ORDER — DESCOVY 200-25 MG PO TABS
1.0000 | ORAL_TABLET | Freq: Every day | ORAL | 2 refills | Status: DC
  Filled 2021-06-21 – 2021-09-06 (×2): qty 30, 30d supply, fill #0
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
## 2021-06-22 LAB — URINE CYTOLOGY ANCILLARY ONLY
Chlamydia: NEGATIVE
Comment: NEGATIVE
Comment: NORMAL
Neisseria Gonorrhea: NEGATIVE

## 2021-06-22 LAB — RPR: RPR Ser Ql: NONREACTIVE

## 2021-06-22 LAB — HIV ANTIBODY (ROUTINE TESTING W REFLEX): HIV 1&2 Ab, 4th Generation: NONREACTIVE

## 2021-08-08 ENCOUNTER — Emergency Department (HOSPITAL_BASED_OUTPATIENT_CLINIC_OR_DEPARTMENT_OTHER): Payer: Self-pay

## 2021-08-08 ENCOUNTER — Observation Stay (HOSPITAL_BASED_OUTPATIENT_CLINIC_OR_DEPARTMENT_OTHER)
Admission: EM | Admit: 2021-08-08 | Discharge: 2021-08-10 | Disposition: A | Discharge status: Home / Self Care | Payer: Self-pay | Attending: Surgery | Admitting: Surgery

## 2021-08-08 ENCOUNTER — Other Ambulatory Visit: Payer: Self-pay

## 2021-08-08 ENCOUNTER — Encounter (HOSPITAL_BASED_OUTPATIENT_CLINIC_OR_DEPARTMENT_OTHER): Payer: Self-pay | Admitting: Emergency Medicine

## 2021-08-08 DIAGNOSIS — Z23 Encounter for immunization: Secondary | ICD-10-CM | POA: Insufficient documentation

## 2021-08-08 DIAGNOSIS — F17210 Nicotine dependence, cigarettes, uncomplicated: Secondary | ICD-10-CM | POA: Insufficient documentation

## 2021-08-08 DIAGNOSIS — A630 Anogenital (venereal) warts: Secondary | ICD-10-CM | POA: Insufficient documentation

## 2021-08-08 DIAGNOSIS — K610 Anal abscess: Principal | ICD-10-CM | POA: Insufficient documentation

## 2021-08-08 DIAGNOSIS — E876 Hypokalemia: Secondary | ICD-10-CM

## 2021-08-08 DIAGNOSIS — I10 Essential (primary) hypertension: Secondary | ICD-10-CM

## 2021-08-08 DIAGNOSIS — K611 Rectal abscess: Secondary | ICD-10-CM

## 2021-08-08 DIAGNOSIS — R509 Fever, unspecified: Secondary | ICD-10-CM

## 2021-08-08 DIAGNOSIS — Z20822 Contact with and (suspected) exposure to covid-19: Secondary | ICD-10-CM | POA: Insufficient documentation

## 2021-08-08 DIAGNOSIS — D72829 Elevated white blood cell count, unspecified: Secondary | ICD-10-CM

## 2021-08-08 LAB — URINALYSIS, ROUTINE W REFLEX MICROSCOPIC
Bilirubin Urine: NEGATIVE
Glucose, UA: NEGATIVE mg/dL
Hgb urine dipstick: NEGATIVE
Ketones, ur: NEGATIVE mg/dL
Leukocytes,Ua: NEGATIVE
Nitrite: NEGATIVE
Protein, ur: NEGATIVE mg/dL
Specific Gravity, Urine: 1.015 (ref 1.005–1.030)
pH: 7 (ref 5.0–8.0)

## 2021-08-08 LAB — BASIC METABOLIC PANEL
Anion gap: 9 (ref 5–15)
BUN: 8 mg/dL (ref 6–20)
CO2: 25 mmol/L (ref 22–32)
Calcium: 8.5 mg/dL — ABNORMAL LOW (ref 8.9–10.3)
Chloride: 99 mmol/L (ref 98–111)
Creatinine, Ser: 0.99 mg/dL (ref 0.61–1.24)
GFR, Estimated: >60 mL/min (ref >60)
Glucose, Bld: 141 mg/dL — ABNORMAL HIGH (ref 70–99)
Potassium: 2.9 mmol/L — ABNORMAL LOW (ref 3.5–5.1)
Sodium: 133 mmol/L — ABNORMAL LOW (ref 135–145)

## 2021-08-08 LAB — CBC WITH DIFFERENTIAL/PLATELET
Abs Immature Granulocytes: 0.06 10*3/uL (ref 0.00–0.07)
Basophils Absolute: 0.1 10*3/uL (ref 0.0–0.1)
Basophils Relative: 0 %
Eosinophils Absolute: 0 10*3/uL (ref 0.0–0.5)
Eosinophils Relative: 0 %
HCT: 37.3 % — ABNORMAL LOW (ref 39.0–52.0)
Hemoglobin: 12.6 g/dL — ABNORMAL LOW (ref 13.0–17.0)
Immature Granulocytes: 0 %
Lymphocytes Relative: 13 %
Lymphs Abs: 1.9 10*3/uL (ref 0.7–4.0)
MCH: 30.6 pg (ref 26.0–34.0)
MCHC: 33.8 g/dL (ref 30.0–36.0)
MCV: 90.5 fL (ref 80.0–100.0)
Monocytes Absolute: 1.2 10*3/uL — ABNORMAL HIGH (ref 0.1–1.0)
Monocytes Relative: 8 %
Neutro Abs: 11.5 10*3/uL — ABNORMAL HIGH (ref 1.7–7.7)
Neutrophils Relative %: 79 %
Platelets: 248 10*3/uL (ref 150–400)
RBC: 4.12 MIL/uL — ABNORMAL LOW (ref 4.22–5.81)
RDW: 11.5 % (ref 11.5–15.5)
WBC: 14.8 10*3/uL — ABNORMAL HIGH (ref 4.0–10.5)
nRBC: 0 % (ref 0.0–0.2)

## 2021-08-08 LAB — RESP PANEL BY RT-PCR (FLU A&B, COVID) ARPGX2
Influenza A by PCR: NEGATIVE
Influenza B by PCR: NEGATIVE
SARS Coronavirus 2 by RT PCR: NEGATIVE

## 2021-08-08 LAB — OCCULT BLOOD X 1 CARD TO LAB, STOOL: Fecal Occult Bld: POSITIVE — AB

## 2021-08-08 LAB — LACTIC ACID, PLASMA: Lactic Acid, Venous: 1.2 mmol/L (ref 0.5–1.9)

## 2021-08-08 MED ORDER — DIAZEPAM 5 MG/ML IJ SOLN: 5.0000 mg | Freq: Three times a day (TID) | INTRAMUSCULAR | Status: DC | PRN

## 2021-08-08 MED ORDER — HYDROMORPHONE HCL 1 MG/ML IJ SOLN
0.5000 mg | INTRAMUSCULAR | Status: DC | PRN
  Administered 2021-08-09 (×2): 1 mg via INTRAVENOUS
  Filled 2021-08-08 (×2): qty 1

## 2021-08-08 MED ORDER — ONDANSETRON HCL 4 MG/2ML IJ SOLN
4.0000 mg | Freq: Once | INTRAMUSCULAR | Status: AC
  Administered 2021-08-08: 4 mg via INTRAVENOUS
  Filled 2021-08-08: qty 2

## 2021-08-08 MED ORDER — LIDOCAINE HCL (PF) 1 % IJ SOLN
10.0000 mL | Freq: Once | INTRAMUSCULAR | Status: DC
  Filled 2021-08-08: qty 10

## 2021-08-08 MED ORDER — ACETAMINOPHEN 500 MG PO TABS
1000.0000 mg | ORAL_TABLET | Freq: Once | ORAL | Status: AC
  Administered 2021-08-08: 1000 mg via ORAL
  Filled 2021-08-08: qty 2

## 2021-08-08 MED ORDER — IOHEXOL 300 MG/ML  SOLN
100.0000 mL | Freq: Once | INTRAMUSCULAR | Status: AC | PRN
  Administered 2021-08-08: 100 mL via INTRAVENOUS

## 2021-08-08 MED ORDER — METRONIDAZOLE 500 MG/100ML IV SOLN
500.0000 mg | Freq: Once | INTRAVENOUS | Status: AC
  Administered 2021-08-08: 500 mg via INTRAVENOUS
  Filled 2021-08-08: qty 100

## 2021-08-08 MED ORDER — ACETAMINOPHEN 500 MG PO TABS
1000.0000 mg | ORAL_TABLET | Freq: Four times a day (QID) | ORAL | Status: DC
  Administered 2021-08-09: 1000 mg via ORAL
  Filled 2021-08-08: qty 2

## 2021-08-08 MED ORDER — FENTANYL CITRATE PF 50 MCG/ML IJ SOSY
50.0000 ug | PREFILLED_SYRINGE | Freq: Once | INTRAMUSCULAR | Status: AC
  Administered 2021-08-08: 50 ug via INTRAVENOUS
  Filled 2021-08-08: qty 1

## 2021-08-08 MED ORDER — OXYCODONE HCL 5 MG PO TABS: 5.0000 mg | ORAL_TABLET | ORAL | Status: DC | PRN

## 2021-08-08 MED ORDER — FENTANYL CITRATE PF 50 MCG/ML IJ SOSY
100.0000 ug | PREFILLED_SYRINGE | Freq: Once | INTRAMUSCULAR | Status: AC
  Administered 2021-08-08: 100 ug via INTRAVENOUS
  Filled 2021-08-08: qty 2

## 2021-08-08 MED ORDER — SODIUM CHLORIDE 0.9 % IV SOLN
2.0000 g | Freq: Once | INTRAVENOUS | Status: AC
  Administered 2021-08-08: 2 g via INTRAVENOUS
  Filled 2021-08-08: qty 20
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
## 2021-08-08 NOTE — Plan of Care (Signed)
40 yo M with peri-rectal abscess.  EDP spoke with Dr. Michaell Cowing, wants admit on IV ABX and he will see in AM.  NPO at [facility].  [facility] will assume care on arrival to accepting facility. Until arrival, care as per EDP. However, [facility] available 24/7 for questions and assistance.  Nursing staff, please page [facility] Admits and Consults [phone]) as soon as the patient arrives the hospital.

## 2021-08-08 NOTE — ED Triage Notes (Addendum)
Pt reports large painful bump "to the side of my rectum"; reports one episode of rectal bleeding earlier this week; pt is not sure if it is a hemorrhoid or abscess; sts he has never had anything like this before

## 2021-08-09 ENCOUNTER — Emergency Department (HOSPITAL_COMMUNITY): Payer: Self-pay | Admitting: Anesthesiology

## 2021-08-09 ENCOUNTER — Encounter (HOSPITAL_COMMUNITY)
Admission: EM | Disposition: A | Discharge status: Home / Self Care | Payer: Self-pay | Source: Home / Self Care | Attending: Emergency Medicine

## 2021-08-09 ENCOUNTER — Encounter (HOSPITAL_COMMUNITY): Payer: Self-pay | Admitting: Internal Medicine

## 2021-08-09 ENCOUNTER — Ambulatory Visit: Admit: 2021-08-09 | Payer: Self-pay | Admitting: Surgery

## 2021-08-09 DIAGNOSIS — E876 Hypokalemia: Secondary | ICD-10-CM

## 2021-08-09 HISTORY — PX: IRRIGATION AND DEBRIDEMENT ABSCESS: SHX5252

## 2021-08-09 LAB — CBC WITH DIFFERENTIAL/PLATELET
Abs Immature Granulocytes: 0.08 10*3/uL — ABNORMAL HIGH (ref 0.00–0.07)
Basophils Absolute: 0 10*3/uL (ref 0.0–0.1)
Basophils Relative: 0 %
Eosinophils Absolute: 0 10*3/uL (ref 0.0–0.5)
Eosinophils Relative: 0 %
HCT: 41.3 % (ref 39.0–52.0)
Hemoglobin: 13.5 g/dL (ref 13.0–17.0)
Immature Granulocytes: 1 %
Lymphocytes Relative: 15 %
Lymphs Abs: 2.4 10*3/uL (ref 0.7–4.0)
MCH: 30 pg (ref 26.0–34.0)
MCHC: 32.7 g/dL (ref 30.0–36.0)
MCV: 91.8 fL (ref 80.0–100.0)
Monocytes Absolute: 1.5 10*3/uL — ABNORMAL HIGH (ref 0.1–1.0)
Monocytes Relative: 10 %
Neutro Abs: 11.8 10*3/uL — ABNORMAL HIGH (ref 1.7–7.7)
Neutrophils Relative %: 74 %
Platelets: 249 10*3/uL (ref 150–400)
RBC: 4.5 MIL/uL (ref 4.22–5.81)
RDW: 11.6 % (ref 11.5–15.5)
WBC: 15.9 10*3/uL — ABNORMAL HIGH (ref 4.0–10.5)
nRBC: 0 % (ref 0.0–0.2)

## 2021-08-09 LAB — BASIC METABOLIC PANEL
Anion gap: 7 (ref 5–15)
BUN: 7 mg/dL (ref 6–20)
CO2: 27 mmol/L (ref 22–32)
Calcium: 8.6 mg/dL — ABNORMAL LOW (ref 8.9–10.3)
Chloride: 102 mmol/L (ref 98–111)
Creatinine, Ser: 1.03 mg/dL (ref 0.61–1.24)
GFR, Estimated: >60 mL/min (ref >60)
Glucose, Bld: 108 mg/dL — ABNORMAL HIGH (ref 70–99)
Potassium: 3.7 mmol/L (ref 3.5–5.1)
Sodium: 136 mmol/L (ref 135–145)

## 2021-08-09 LAB — POCT I-STAT EG7
Acid-Base Excess: 4 mmol/L — ABNORMAL HIGH (ref 0.0–2.0)
Bicarbonate: 30.2 mmol/L — ABNORMAL HIGH (ref 20.0–28.0)
Calcium, Ion: 1.1 mmol/L — ABNORMAL LOW (ref 1.15–1.40)
HCT: 43 % (ref 39.0–52.0)
Hemoglobin: 14.6 g/dL (ref 13.0–17.0)
O2 Saturation: 54 %
Potassium: 3.8 mmol/L (ref 3.5–5.1)
Sodium: 137 mmol/L (ref 135–145)
TCO2: 32 mmol/L (ref 22–32)
pCO2, Ven: 48.7 mmHg (ref 44.0–60.0)
pH, Ven: 7.401 (ref 7.250–7.430)
pO2, Ven: 29 mmHg — CL (ref 32.0–45.0)

## 2021-08-09 LAB — MAGNESIUM: Magnesium: 1.8 mg/dL (ref 1.7–2.4)

## 2021-08-09 LAB — HEMOGLOBIN A1C
Hgb A1c MFr Bld: 5.6 % (ref 4.8–5.6)
Mean Plasma Glucose: 114.02 mg/dL

## 2021-08-09 SURGERY — IRRIGATION AND DEBRIDEMENT ABSCESS
Anesthesia: General | Site: Perineum

## 2021-08-09 MED ORDER — BUPIVACAINE-EPINEPHRINE (PF) 0.25% -1:200000 IJ SOLN
INTRAMUSCULAR | Status: AC
  Filled 2021-08-09: qty 30

## 2021-08-09 MED ORDER — PROMETHAZINE HCL 25 MG/ML IJ SOLN: 6.2500 mg | INTRAMUSCULAR | Status: DC | PRN

## 2021-08-09 MED ORDER — INFLUENZA VAC SPLIT QUAD 0.5 ML IM SUSY
0.5000 mL | PREFILLED_SYRINGE | INTRAMUSCULAR | Status: AC
  Administered 2021-08-10: 0.5 mL via INTRAMUSCULAR
  Filled 2021-08-09: qty 0.5

## 2021-08-09 MED ORDER — CHLORHEXIDINE GLUCONATE 0.12 % MT SOLN
15.0000 mL | Freq: Once | OROMUCOSAL | Status: AC
  Administered 2021-08-09: 15 mL via OROMUCOSAL

## 2021-08-09 MED ORDER — OXYCODONE HCL 5 MG/5ML PO SOLN: 5.0000 mg | Freq: Once | ORAL | Status: DC | PRN

## 2021-08-09 MED ORDER — POTASSIUM CHLORIDE 10 MEQ/100ML IV SOLN
10.0000 meq | INTRAVENOUS | Status: DC
  Administered 2021-08-09 (×3): 10 meq via INTRAVENOUS
  Filled 2021-08-09 (×3): qty 100

## 2021-08-09 MED ORDER — MIDAZOLAM HCL 5 MG/5ML IJ SOLN
INTRAMUSCULAR | Status: DC | PRN
  Administered 2021-08-09: 2 mg via INTRAVENOUS

## 2021-08-09 MED ORDER — DEXAMETHASONE SODIUM PHOSPHATE 4 MG/ML IJ SOLN
INTRAMUSCULAR | Status: DC | PRN
  Administered 2021-08-09: 10 mg via INTRAVENOUS

## 2021-08-09 MED ORDER — AMISULPRIDE (ANTIEMETIC) 5 MG/2ML IV SOLN: 10.0000 mg | Freq: Once | INTRAVENOUS | Status: DC | PRN

## 2021-08-09 MED ORDER — OXYCODONE HCL 5 MG PO TABS: 5.0000 mg | ORAL_TABLET | ORAL | Status: DC | PRN

## 2021-08-09 MED ORDER — ROCURONIUM BROMIDE 100 MG/10ML IV SOLN
INTRAVENOUS | Status: DC | PRN
  Administered 2021-08-09: 30 mg via INTRAVENOUS

## 2021-08-09 MED ORDER — DEXAMETHASONE SODIUM PHOSPHATE 10 MG/ML IJ SOLN
INTRAMUSCULAR | Status: AC
  Filled 2021-08-09: qty 1

## 2021-08-09 MED ORDER — GABAPENTIN 300 MG PO CAPS
300.0000 mg | ORAL_CAPSULE | Freq: Two times a day (BID) | ORAL | Status: DC
  Administered 2021-08-09 – 2021-08-10 (×3): 300 mg via ORAL
  Filled 2021-08-09 (×3): qty 1

## 2021-08-09 MED ORDER — MIDAZOLAM HCL 2 MG/2ML IJ SOLN
INTRAMUSCULAR | Status: AC
  Filled 2021-08-09: qty 2

## 2021-08-09 MED ORDER — DIPHENHYDRAMINE HCL 25 MG PO CAPS: 25.0000 mg | ORAL_CAPSULE | Freq: Four times a day (QID) | ORAL | Status: DC | PRN

## 2021-08-09 MED ORDER — ONDANSETRON HCL 4 MG/2ML IJ SOLN
INTRAMUSCULAR | Status: DC | PRN
  Administered 2021-08-09: 4 mg via INTRAVENOUS

## 2021-08-09 MED ORDER — METRONIDAZOLE 500 MG/100ML IV SOLN
500.0000 mg | Freq: Three times a day (TID) | INTRAVENOUS | Status: DC
  Administered 2021-08-09: 500 mg via INTRAVENOUS
  Filled 2021-08-09: qty 100

## 2021-08-09 MED ORDER — DEXMEDETOMIDINE (PRECEDEX) IN NS 20 MCG/5ML (4 MCG/ML) IV SYRINGE
PREFILLED_SYRINGE | INTRAVENOUS | Status: AC
  Filled 2021-08-09: qty 5

## 2021-08-09 MED ORDER — SODIUM CHLORIDE 0.9 % IV SOLN: INTRAVENOUS | Status: DC

## 2021-08-09 MED ORDER — LIDOCAINE HCL (PF) 2 % IJ SOLN
INTRAMUSCULAR | Status: AC
  Filled 2021-08-09: qty 5

## 2021-08-09 MED ORDER — PROPOFOL 10 MG/ML IV BOLUS
INTRAVENOUS | Status: DC | PRN
  Administered 2021-08-09: 200 mg via INTRAVENOUS

## 2021-08-09 MED ORDER — SUGAMMADEX SODIUM 200 MG/2ML IV SOLN
INTRAVENOUS | Status: DC | PRN
  Administered 2021-08-09: 200 mg via INTRAVENOUS

## 2021-08-09 MED ORDER — FENTANYL CITRATE (PF) 100 MCG/2ML IJ SOLN
INTRAMUSCULAR | Status: AC
  Filled 2021-08-09: qty 2

## 2021-08-09 MED ORDER — HYDROMORPHONE HCL 1 MG/ML IJ SOLN: 0.2500 mg | INTRAMUSCULAR | Status: DC | PRN

## 2021-08-09 MED ORDER — FENTANYL CITRATE (PF) 100 MCG/2ML IJ SOLN
INTRAMUSCULAR | Status: DC | PRN
  Administered 2021-08-09: 100 ug via INTRAVENOUS

## 2021-08-09 MED ORDER — SUCCINYLCHOLINE CHLORIDE 200 MG/10ML IV SOSY
PREFILLED_SYRINGE | INTRAVENOUS | Status: AC
  Filled 2021-08-09: qty 10

## 2021-08-09 MED ORDER — BUPIVACAINE-EPINEPHRINE 0.25% -1:200000 IJ SOLN
INTRAMUSCULAR | Status: DC | PRN
  Administered 2021-08-09: 30 mL

## 2021-08-09 MED ORDER — ENOXAPARIN SODIUM 40 MG/0.4ML IJ SOSY: 40.0000 mg | PREFILLED_SYRINGE | INTRAMUSCULAR | Status: DC

## 2021-08-09 MED ORDER — SODIUM CHLORIDE 0.9 % IV SOLN
2.0000 g | INTRAVENOUS | Status: DC
  Filled 2021-08-09: qty 20

## 2021-08-09 MED ORDER — ROCURONIUM BROMIDE 10 MG/ML (PF) SYRINGE
PREFILLED_SYRINGE | INTRAVENOUS | Status: AC
  Filled 2021-08-09: qty 10

## 2021-08-09 MED ORDER — LIDOCAINE HCL (CARDIAC) PF 100 MG/5ML IV SOSY
PREFILLED_SYRINGE | INTRAVENOUS | Status: DC | PRN
  Administered 2021-08-09: 60 mg via INTRATRACHEAL

## 2021-08-09 MED ORDER — DIPHENHYDRAMINE HCL 50 MG/ML IJ SOLN: 25.0000 mg | Freq: Four times a day (QID) | INTRAMUSCULAR | Status: DC | PRN

## 2021-08-09 MED ORDER — OXYCODONE HCL 5 MG PO TABS: 5.0000 mg | ORAL_TABLET | Freq: Once | ORAL | Status: DC | PRN

## 2021-08-09 MED ORDER — 0.9 % SODIUM CHLORIDE (POUR BTL) OPTIME
TOPICAL | Status: DC | PRN
  Administered 2021-08-09: 1000 mL

## 2021-08-09 MED ORDER — AMOXICILLIN-POT CLAVULANATE 875-125 MG PO TABS
1.0000 | ORAL_TABLET | Freq: Two times a day (BID) | ORAL | Status: DC
  Administered 2021-08-09 – 2021-08-10 (×3): 1 via ORAL
  Filled 2021-08-09 (×3): qty 1

## 2021-08-09 MED ORDER — ONDANSETRON 4 MG PO TBDP: 4.0000 mg | ORAL_TABLET | Freq: Four times a day (QID) | ORAL | Status: DC | PRN

## 2021-08-09 MED ORDER — KETOROLAC TROMETHAMINE 30 MG/ML IJ SOLN
INTRAMUSCULAR | Status: AC
  Administered 2021-08-09: 30 mg via INTRAVENOUS
  Filled 2021-08-09: qty 1

## 2021-08-09 MED ORDER — HYDROMORPHONE HCL 1 MG/ML IJ SOLN
1.0000 mg | INTRAMUSCULAR | Status: DC | PRN
  Administered 2021-08-09 – 2021-08-10 (×7): 1 mg via INTRAVENOUS
  Filled 2021-08-09 (×7): qty 1

## 2021-08-09 MED ORDER — LACTATED RINGERS IV SOLN: INTRAVENOUS | Status: DC

## 2021-08-09 MED ORDER — KETOROLAC TROMETHAMINE 30 MG/ML IJ SOLN: 30.0000 mg | Freq: Once | INTRAMUSCULAR | Status: AC | PRN

## 2021-08-09 MED ORDER — DEXMEDETOMIDINE (PRECEDEX) IN NS 20 MCG/5ML (4 MCG/ML) IV SYRINGE
PREFILLED_SYRINGE | INTRAVENOUS | Status: DC | PRN
  Administered 2021-08-09: 12 ug via INTRAVENOUS
  Administered 2021-08-09: 8 ug via INTRAVENOUS

## 2021-08-09 MED ORDER — POTASSIUM CHLORIDE CRYS ER 20 MEQ PO TBCR
40.0000 meq | EXTENDED_RELEASE_TABLET | Freq: Once | ORAL | Status: AC
  Administered 2021-08-09: 40 meq via ORAL
  Filled 2021-08-09: qty 2

## 2021-08-09 MED ORDER — TRAMADOL HCL 50 MG PO TABS: 50.0000 mg | ORAL_TABLET | Freq: Four times a day (QID) | ORAL | Status: DC | PRN

## 2021-08-09 MED ORDER — SUCCINYLCHOLINE CHLORIDE 200 MG/10ML IV SOSY
PREFILLED_SYRINGE | INTRAVENOUS | Status: DC | PRN
  Administered 2021-08-09: 160 mg via INTRAVENOUS

## 2021-08-09 MED ORDER — MEPERIDINE HCL 50 MG/ML IJ SOLN: 6.2500 mg | INTRAMUSCULAR | Status: DC | PRN

## 2021-08-09 MED ORDER — METOPROLOL TARTRATE 5 MG/5ML IV SOLN
5.0000 mg | Freq: Four times a day (QID) | INTRAVENOUS | Status: DC | PRN
  Filled 2021-08-09: qty 5

## 2021-08-09 MED ORDER — ONDANSETRON HCL 4 MG/2ML IJ SOLN
INTRAMUSCULAR | Status: AC
  Filled 2021-08-09: qty 2

## 2021-08-09 MED ORDER — ONDANSETRON HCL 4 MG/2ML IJ SOLN: 4.0000 mg | Freq: Four times a day (QID) | INTRAMUSCULAR | Status: DC | PRN

## 2021-08-09 SURGICAL SUPPLY — 40 items
BAG COUNTER SPONGE SURGICOUNT (BAG) IMPLANT
BLADE HEX COATED 2.75 (ELECTRODE) IMPLANT
BLADE SURG SZ10 CARB STEEL (BLADE) ×2 IMPLANT
BNDG CONFORM 2 STRL LF (GAUZE/BANDAGES/DRESSINGS) ×2 IMPLANT
COVER SURGICAL LIGHT HANDLE (MISCELLANEOUS) ×2 IMPLANT
DECANTER SPIKE VIAL GLASS SM (MISCELLANEOUS) IMPLANT
DERMABOND ADVANCED (GAUZE/BANDAGES/DRESSINGS)
DERMABOND ADVANCED .7 DNX12 (GAUZE/BANDAGES/DRESSINGS) IMPLANT
DRAPE LAPAROSCOPIC ABDOMINAL (DRAPES) IMPLANT
DRAPE LAPAROTOMY T 102X78X121 (DRAPES) IMPLANT
DRAPE LAPAROTOMY TRNSV 102X78 (DRAPES) IMPLANT
DRAPE SHEET LG 3/4 BI-LAMINATE (DRAPES) ×2 IMPLANT
DRAPE UNDERBUTTOCKS STRL (DISPOSABLE) ×2 IMPLANT
ELECT REM PT RETURN 15FT ADLT (MISCELLANEOUS) ×2 IMPLANT
GAUZE SPONGE 4X4 12PLY STRL (GAUZE/BANDAGES/DRESSINGS) ×2 IMPLANT
GLOVE SURG ENC MOIS LTX SZ7 (GLOVE) ×6 IMPLANT
GLOVE SURG ENC MOIS LTX SZ7.5 (GLOVE) ×2 IMPLANT
GLOVE SURG UNDER POLY LF SZ7 (GLOVE) ×2 IMPLANT
GLOVE SURG UNDER POLY LF SZ7.5 (GLOVE) ×6 IMPLANT
GOWN STRL REUS W/ TWL XL LVL3 (GOWN DISPOSABLE) ×1 IMPLANT
GOWN STRL REUS W/TWL LRG LVL3 (GOWN DISPOSABLE) ×4 IMPLANT
GOWN STRL REUS W/TWL XL LVL3 (GOWN DISPOSABLE) ×4 IMPLANT
KIT BASIN OR (CUSTOM PROCEDURE TRAY) ×2 IMPLANT
KIT TURNOVER KIT A (KITS) IMPLANT
LEGGING LITHOTOMY PAIR STRL (DRAPES) ×2 IMPLANT
MARKER SKIN DUAL TIP RULER LAB (MISCELLANEOUS) ×2 IMPLANT
NDL SAFETY ECLIPSE 18X1.5 (NEEDLE) ×1 IMPLANT
NEEDLE HYPO 18GX1.5 SHARP (NEEDLE) ×2
NEEDLE HYPO 25X1 1.5 SAFETY (NEEDLE) ×2 IMPLANT
NS IRRIG 1000ML POUR BTL (IV SOLUTION) ×2 IMPLANT
PACK BASIC VI WITH GOWN DISP (CUSTOM PROCEDURE TRAY) ×2 IMPLANT
PENCIL SMOKE EVACUATOR (MISCELLANEOUS) ×2 IMPLANT
SPONGE T-LAP 18X18 ~~LOC~~+RFID (SPONGE) ×2 IMPLANT
SPONGE T-LAP 4X18 ~~LOC~~+RFID (SPONGE) IMPLANT
STAPLER VISISTAT 35W (STAPLE) IMPLANT
SUT MNCRL AB 4-0 PS2 18 (SUTURE) IMPLANT
SUT VIC AB 3-0 SH 18 (SUTURE) IMPLANT
SYR CONTROL 10ML LL (SYRINGE) ×4 IMPLANT
TOWEL OR 17X26 10 PK STRL BLUE (TOWEL DISPOSABLE) ×2 IMPLANT
YANKAUER SUCT BULB TIP 10FT TU (MISCELLANEOUS) ×2 IMPLANT
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
## 2021-08-09 NOTE — Anesthesia Preprocedure Evaluation (Addendum)
Anesthesia Evaluation  Patient identified by MRN, date of birth, ID band Patient awake    Reviewed: Allergy & Precautions, NPO status , Patient's Chart, lab work & pertinent test results  Airway Mallampati: I  TM Distance: >3 FB Neck ROM: Full    Dental no notable dental hx. (+) Teeth Intact, Dental Advisory Given   Pulmonary Current Smoker and Patient abstained from smoking.,    Pulmonary exam normal breath sounds clear to auscultation       Cardiovascular hypertension, Normal cardiovascular exam Rhythm:Regular Rate:Normal     Neuro/Psych  Headaches, negative psych ROS   GI/Hepatic negative GI ROS, (+)     substance abuse  marijuana use,   Endo/Other  negative endocrine ROSHypokalemia K 2.9  Renal/GU negative Renal ROS  negative genitourinary   Musculoskeletal Chronic LBP   Abdominal   Peds  Hematology hct 37.3  descovy for high risk of HIV, all testing has been negative   Anesthesia Other Findings Perirectal abscess  Reproductive/Obstetrics negative OB ROS                          Anesthesia Physical Anesthesia Plan  ASA: 2  Anesthesia Plan: General   Post-op Pain Management:    Induction: Intravenous  PONV Risk Score and Plan: 2 and Ondansetron, Dexamethasone, Midazolam and Treatment may vary due to age or medical condition  Airway Management Planned: Oral ETT  Additional Equipment: None  Intra-op Plan:   Post-operative Plan: Extubation in OR  Informed Consent: I have reviewed the patients History and Physical, chart, labs and discussed the procedure including the risks, benefits and alternatives for the proposed anesthesia with the patient or authorized representative who has indicated his/her understanding and acceptance.     Dental advisory given  Plan Discussed with: CRNA  Anesthesia Plan Comments: (istat in preop for potassium level: K 3.8 after 3 runs of  IV)      Anesthesia Quick Evaluation

## 2021-08-09 NOTE — Op Note (Signed)
Incision Perianal Abscess Procedure Note  Indications: This patient presents with symptomatic left perianal pain and induration requiring surgical drainage.  Pre-operative Diagnosis: Perianal Abscess  Post-operative Diagnosis: Perianal Abscess  Surgeon: Abigail Miyamoto, MD  Assistants: Basilio Cairo, MD  Anesthesia: General endotracheal anesthesia  ASA Class: 2  Procedure Details  The patient was seen again in the Holding Room. The risks, benefits, complications, treatment options, and expected outcomes were discussed with the patient. The possibilities of reaction to medication, pulmonary aspiration, bleeding, recurrent infection, development of a fistula, and the need for additional procedures were discussed with the patient. The likelihood of improving the patient's symptoms with return to their baseline status is good.  The patient and/or family concurred with the proposed plan, giving informed consent. The site of surgery properly noted. The patient was taken to Operating Room, identified as Lonzo Cloud and the procedure verified as Left Perianal Abscess Incision and Drainage. A Time Out was held and the above information confirmed.  General endotracheal anesthesia was then administered and tolerated well. After the induction, the patient was placed in lithotomy. The perineum and anus were prepped with Betadine and draped in sterile fashion.  The area of concern was palpable along the left anal verge. An 18-gauge needle was used to aspirate the abscess with immediate return of purulence. The 18-gauge needle was then handed back and an 11-blade was used to open the skin overlying the area of induration. There was an immediate return of purulent material. The abscess cavity was bluntly dissected/swept and found to extend cranially approximately 8cm. An elliptical incision was made over the opening to ensure patency, hemostasis was achieved with electrocautery, and the cavity was packed  with a single piece of guaze.  The wound was dressed with abdominal pad and mesh panties. The patient was then extubated and brought to the recovery room in stable condition. Instrument, sponge, and needle counts were correct at closure and at the conclusion of the case.   Findings: Left Perianal Abscess  Estimated Blood Loss: 5cc         Specimens: N/a        Complications: None; patient tolerated the procedure well.         Disposition: PACU - hemodynamically stable.         Condition: stable   Douglas A. Magnus Ivan, MD CCS

## 2021-08-09 NOTE — Anesthesia Postprocedure Evaluation (Signed)
Anesthesia Post Note  Patient: Jeremy Bell  Procedure(s) Performed: IRRIGATION AND DEBRIDEMENT ABSCESS perirectal (Perineum)     Patient location during evaluation: PACU Anesthesia Type: General Level of consciousness: awake and alert, oriented and patient cooperative Pain management: pain level controlled Vital Signs Assessment: post-procedure vital signs reviewed and stable Respiratory status: spontaneous breathing, nonlabored ventilation and respiratory function stable Cardiovascular status: blood pressure returned to baseline and stable Postop Assessment: no apparent nausea or vomiting Anesthetic complications: no   No notable events documented.  Last Vitals:  Vitals:   08/09/21 1100 08/09/21 1115  BP: (!) 151/84   Pulse: 89 88  Resp: 19 (!) 26  Temp:    SpO2: 100% 94%    Last Pain:  Vitals:   08/09/21 1130  TempSrc:   PainSc: 0-No pain                 Lannie Fields

## 2021-08-09 NOTE — Transfer of Care (Signed)
Immediate Anesthesia Transfer of Care Note  Patient: Jeremy Bell  Procedure(s) Performed: IRRIGATION AND DEBRIDEMENT ABSCESS perirectal (Perineum)  Patient Location: PACU  Anesthesia Type:General  Level of Consciousness: awake and alert   Airway & Oxygen Therapy: Patient Spontanous Breathing and Patient connected to face mask oxygen  Post-op Assessment: Report given to RN and Post -op Vital signs reviewed and stable  Post vital signs: Reviewed and stable  Last Vitals:  Vitals Value Taken Time  BP 151/84 08/09/21 1058  Temp    Pulse 93 08/09/21 1102  Resp 30 08/09/21 1102  SpO2 86 % 08/09/21 1102  Vitals shown include unvalidated device data.  Last Pain:  Vitals:   08/09/21 0931  TempSrc: Oral  PainSc:       Patients Stated Pain Goal: 3 (08/09/21 1000)  Complications: No notable events documented.

## 2021-08-09 NOTE — Anesthesia Procedure Notes (Signed)
Procedure Name: Intubation Date/Time: 08/09/2021 10:25 AM Performed by: Caren Macadam, CRNA Pre-anesthesia Checklist: Patient identified, Emergency Drugs available, Suction available and Patient being monitored Patient Re-evaluated:Patient Re-evaluated prior to induction Oxygen Delivery Method: Circle system utilized Preoxygenation: Pre-oxygenation with 100% oxygen Induction Type: IV induction Ventilation: Mask ventilation without difficulty Laryngoscope Size: Miller and 2 Grade View: Grade I Tube type: Oral Tube size: 7.5 mm Number of attempts: 1 Airway Equipment and Method: Stylet Placement Confirmation: ETT inserted through vocal cords under direct vision, positive ETCO2 and breath sounds checked- equal and bilateral Secured at: 23 cm Tube secured with: Tape Dental Injury: Teeth and Oropharynx as per pre-operative assessment

## 2021-08-10 ENCOUNTER — Encounter (HOSPITAL_COMMUNITY): Payer: Self-pay | Admitting: Surgery

## 2021-08-10 MED ORDER — POLYETHYLENE GLYCOL 3350 17 G PO PACK: 17.0000 g | PACK | Freq: Every day | ORAL | Status: AC | PRN

## 2021-08-10 MED ORDER — DOCUSATE SODIUM 100 MG PO CAPS: 100.0000 mg | ORAL_CAPSULE | Freq: Every day | ORAL | 0 refills | Status: AC | PRN

## 2021-08-10 MED ORDER — OXYCODONE HCL 5 MG PO TABS
5.0000 mg | ORAL_TABLET | ORAL | Status: DC | PRN
  Administered 2021-08-10: 5 mg via ORAL
  Filled 2021-08-10: qty 1

## 2021-08-10 MED ORDER — ACETAMINOPHEN 325 MG PO TABS: 650.0000 mg | ORAL_TABLET | Freq: Four times a day (QID) | ORAL | Status: DC | PRN

## 2021-08-10 MED ORDER — HYDROMORPHONE HCL 1 MG/ML IJ SOLN: 0.5000 mg | INTRAMUSCULAR | Status: DC | PRN

## 2021-08-10 MED ORDER — OXYCODONE HCL 5 MG PO TABS: 5.0000 mg | ORAL_TABLET | Freq: Four times a day (QID) | ORAL | 0 refills | Status: DC | PRN

## 2021-08-10 MED ORDER — DOCUSATE SODIUM 100 MG PO CAPS
100.0000 mg | ORAL_CAPSULE | Freq: Two times a day (BID) | ORAL | Status: DC
  Administered 2021-08-10: 100 mg via ORAL
  Filled 2021-08-10: qty 1

## 2021-08-10 MED ORDER — AMOXICILLIN-POT CLAVULANATE 875-125 MG PO TABS: 1.0000 | ORAL_TABLET | Freq: Two times a day (BID) | ORAL | 0 refills | Status: AC

## 2021-08-10 MED ORDER — IBUPROFEN 200 MG PO TABS: 400.0000 mg | ORAL_TABLET | Freq: Four times a day (QID) | ORAL | Status: AC | PRN

## 2021-08-10 MED ORDER — POLYETHYLENE GLYCOL 3350 17 G PO PACK: 17.0000 g | PACK | Freq: Every day | ORAL | Status: DC | PRN

## 2021-08-10 MED ORDER — ONDANSETRON 4 MG PO TBDP: 4.0000 mg | ORAL_TABLET | Freq: Four times a day (QID) | ORAL | 0 refills | Status: DC | PRN

## 2021-08-10 MED ORDER — ACETAMINOPHEN 325 MG PO TABS
650.0000 mg | ORAL_TABLET | Freq: Four times a day (QID) | ORAL | Status: DC
  Administered 2021-08-10: 650 mg via ORAL
  Filled 2021-08-10: qty 2

## 2021-08-10 NOTE — Progress Notes (Signed)
Pt discharged home in stable condition. Discharge instructions given. Script sent to pharmacy of choice. Coupons and work note given to patient. No immediate questions or concerns at this time. Discharged from unit via wheelchair.

## 2021-08-10 NOTE — Plan of Care (Signed)
  Problem: Education: Goal: Required Educational Video(s) Outcome: Completed/Met   Problem: Clinical Measurements: Goal: Postoperative complications will be avoided or minimized Outcome: Completed/Met   Problem: Skin Integrity: Goal: Demonstration of wound healing without infection will improve Outcome: Completed/Met   Problem: Education: Goal: Knowledge of General Education information will improve Description: Including pain rating scale, medication(s)/side effects and non-pharmacologic comfort measures Outcome: Completed/Met   Problem: Health Behavior/Discharge Planning: Goal: Ability to manage health-related needs will improve Outcome: Completed/Met   Problem: Clinical Measurements: Goal: Ability to maintain clinical measurements within normal limits will improve Outcome: Completed/Met Goal: Will remain free from infection Outcome: Completed/Met Goal: Diagnostic test results will improve Outcome: Completed/Met Goal: Respiratory complications will improve Outcome: Completed/Met Goal: Cardiovascular complication will be avoided Outcome: Completed/Met   Problem: Activity: Goal: Risk for activity intolerance will decrease Outcome: Completed/Met   Problem: Nutrition: Goal: Adequate nutrition will be maintained Outcome: Completed/Met   Problem: Coping: Goal: Level of anxiety will decrease Outcome: Completed/Met   Problem: Elimination: Goal: Will not experience complications related to bowel motility Outcome: Completed/Met Goal: Will not experience complications related to urinary retention Outcome: Completed/Met   Problem: Pain Managment: Goal: General experience of comfort will improve Outcome: Completed/Met   Problem: Safety: Goal: Ability to remain free from injury will improve Outcome: Completed/Met   Problem: Skin Integrity: Goal: Risk for impaired skin integrity will decrease Outcome: Completed/Met   Problem: Education: Goal: Required Educational  Video(s) Outcome: Completed/Met   Problem: Clinical Measurements: Goal: Postoperative complications will be avoided or minimized Outcome: Completed/Met   Problem: Skin Integrity: Goal: Demonstration of wound healing without infection will improve Outcome: Completed/Met

## 2021-08-10 NOTE — Discharge Summary (Signed)
[facility] Discharge Summary   Patient ID: Jeremy Bell MRN: [id number] DOB/AGE: Dec 05, 1980 40 y.o.  Admit date: 08/08/2021 Discharge date: 08/10/2021  Admitting Diagnosis: Perirectal abscess  Discharge Diagnosis Same as above  Consultants None   Imaging: CT ABDOMEN PELVIS W CONTRAST  Result Date: 08/08/2021 CLINICAL DATA:  Abdominal pain, acute. Rectal pain. Gluteal swelling and fever. Concern for abscess. Requested scan to the scrotum. EXAM: CT ABDOMEN AND PELVIS WITH CONTRAST TECHNIQUE: Multidetector CT imaging of the abdomen and pelvis was performed using the standard protocol following bolus administration of intravenous contrast. CONTRAST:  OMNIPAQUE IOHEXOL 300 MG/ML  SOLN COMPARISON:  Abdominopelvic CT 03/13/2014, pelvis CT 10/19/2015 FINDINGS: Lower chest: No pleural effusion or acute airspace disease. The heart is normal in size. Hepatobiliary: No focal liver abnormality is seen. Status post cholecystectomy. No biliary dilatation. Pancreas: Unremarkable. No pancreatic ductal dilatation or surrounding inflammatory changes. Spleen: Normal in size without focal abnormality. Adrenals/Urinary Tract: Normal adrenal glands. There is mild right perinephric edema and trace amount of perinephric fluid. Slight heterogeneous right renal enhancement. Low-density lesion in the lower right kidney is consistent with cyst, and unchanged from prior exam. There is no hydronephrosis. No renal or ureteral calculi. Normal appearance of the left kidney. Unremarkable urinary bladder Stomach/Bowel: The stomach is nondistended. Occasional fluid-filled loops of small bowel without obstruction, inflammation, or abnormal distention. Normal appendix. Small volume of stool throughout the colon. Left perirectal fluid collection measures 2.5 x 1.9 x 3.1 cm, for example series 2, image 104. There is peripheral enhancement in adjacent inflammation consistent with abscess. Fat stranding and  inflammatory changes extend in the left gluteal soft tissues. There is no internal or soft tissue air. No inflammatory extension to the included perineum anteriorly. Vascular/Lymphatic: Few prominent left inguinal nodes are likely reactive. No abdominal adenopathy. Normal caliber abdominal aorta. Patent portal vein. Reproductive: Prostate is unremarkable. The scrotum is not entirely included in the field of view. Other: No ascites or free air. Tiny fat containing umbilical hernia. Other than perirectal abscess, no fluid collection. Musculoskeletal: Mild L5-S1 degenerative disc disease. There are no acute or suspicious osseous abnormalities. IMPRESSION: 1. Left perirectal abscess measuring 2.5 x 1.9 x 3.1 cm. Fat stranding and inflammatory changes extend in the left gluteal soft tissues. No internal or soft tissue air. 2. Mild right perinephric edema and trace amount of perinephric fluid, can be seen with urinary tract infection. Recommend correlation with urinalysis. Electronically Signed   By: Narda Rutherford M.D.   On: 08/08/2021 21:53    Procedures Dr. Abigail Miyamoto (08/09/21) - Incision and drainage  Hospital Course:  Patient is a 40 year old male who presented to [facility] with rectal pain.  Workup showed perirectal abscess.  Patient was transferred to [facility] and underwent procedure listed above.  Tolerated procedure well and was transferred to the floor post-operatively for observastion.  Diet was advanced as tolerated.  On POD1, the patient was voiding well, tolerating diet, ambulating well, pain well controlled, vital signs stable, packing removed and felt stable for discharge home.  Patient will follow up in our office in 2 weeks and knows to call with questions or concerns.  He will call to confirm appointment date/time.    Physical Exam: General:  Alert, NAD, pleasant, comfortable Abd:  Soft, ND, NT GU: packing removed from L buttock without complication, minimal bloody drainage present, minimal  induration present   I or a member of my team have reviewed this patient in the Controlled Substance Database.  Allergies as of 08/10/2021   No Known Allergies      Medication List     TAKE these medications    acetaminophen 325 MG tablet Commonly known as: TYLENOL Take 2 tablets (650 mg total) by mouth every 6 (six) hours as needed for mild pain.   amoxicillin-clavulanate 875-125 MG tablet Commonly known as: AUGMENTIN Take 1 tablet by mouth every 12 (twelve) hours for 7 days.   Biotin 42353 MCG Tabs Take 10,000 mcg by mouth daily.   Descovy 200-25 MG tablet Generic drug: emtricitabine-tenofovir AF Take 1 tablet by mouth daily.   docusate sodium 100 MG capsule Commonly known as: COLACE Take 1 capsule (100 mg total) by mouth daily as needed for mild constipation.   ibuprofen 200 MG tablet Commonly known as: Motrin IB Take 2 tablets (400 mg total) by mouth every 6 (six) hours as needed for moderate pain.   multivitamin tablet Take 1 tablet by mouth daily.   ondansetron 4 MG disintegrating tablet Commonly known as: ZOFRAN-ODT Take 1 tablet (4 mg total) by mouth every 6 (six) hours as needed for nausea.   oxyCODONE 5 MG immediate release tablet Commonly known as: Oxy IR/ROXICODONE Take 1 tablet (5 mg total) by mouth every 6 (six) hours as needed for moderate pain or severe pain.   polyethylene glycol 17 g packet Commonly known as: MIRALAX / GLYCOLAX Take 17 g by mouth daily as needed for mild constipation.          Follow-up Information     [facility], [facility]. Schedule an appointment as soon as possible for a visit in 2 week(s).   Specialty: General Surgery Why: Our office should be working on scheduling a follow up appointment in 2 weeks. Please call to confirm appointment date/time if you do not hear from them in the next few days. Contact information: [address] [place] [address] [phone]                  Signed: Juliet Rude , [facility] 08/10/2021, 10:09 AM Please see Amion for pager number during day hours 7:00am-4:30pm

## 2021-08-10 NOTE — TOC Transition Note (Signed)
Transition of Care [facility]) - CM/SW Discharge Note   Patient Details  Name: Jeremy Bell MRN: [id number] Date of Birth: 01-08-1981  [facility] CM/SW Contact:  Bartholome Bill, RN Phone Number: 08/10/2021, 12:04 PM   Clinical Narrative:     Good Rx coupons provided to pt for his dc medications.

## 2021-08-13 LAB — CULTURE, BLOOD (ROUTINE X 2)
Culture: NO GROWTH
Culture: NO GROWTH
Special Requests: ADEQUATE
Special Requests: ADEQUATE

## 2021-09-06 ENCOUNTER — Other Ambulatory Visit (HOSPITAL_COMMUNITY): Payer: Self-pay

## 2021-09-22 ENCOUNTER — Other Ambulatory Visit (HOSPITAL_COMMUNITY): Payer: Self-pay

## 2021-09-22 ENCOUNTER — Other Ambulatory Visit: Payer: Self-pay

## 2021-09-22 ENCOUNTER — Ambulatory Visit (INDEPENDENT_AMBULATORY_CARE_PROVIDER_SITE_OTHER): Payer: Self-pay | Admitting: Pharmacist

## 2021-09-22 DIAGNOSIS — Z7252 High risk homosexual behavior: Secondary | ICD-10-CM

## 2021-09-22 DIAGNOSIS — Z113 Encounter for screening for infections with a predominantly sexual mode of transmission: Secondary | ICD-10-CM

## 2021-09-22 DIAGNOSIS — Z79899 Other long term (current) drug therapy: Secondary | ICD-10-CM

## 2021-09-22 MED ORDER — DESCOVY 200-25 MG PO TABS
1.0000 | ORAL_TABLET | Freq: Every day | ORAL | 2 refills | Status: DC
  Filled 2021-09-22 – 2021-09-29 (×3): qty 30, 30d supply, fill #0
  Filled 2021-10-29: qty 30, 30d supply, fill #1
  Filled 2021-11-24 – 2021-12-16 (×2): qty 30, 30d supply, fill #2
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
## 2021-09-23 LAB — RPR: RPR Ser Ql: NONREACTIVE

## 2021-09-23 LAB — URINE CYTOLOGY ANCILLARY ONLY
Chlamydia: NEGATIVE
Comment: NEGATIVE
Comment: NORMAL
Neisseria Gonorrhea: NEGATIVE

## 2021-09-23 LAB — HIV ANTIBODY (ROUTINE TESTING W REFLEX): HIV 1&2 Ab, 4th Generation: NONREACTIVE

## 2021-09-23 LAB — CYTOLOGY, (ORAL, ANAL, URETHRAL) ANCILLARY ONLY
Chlamydia: NEGATIVE
Comment: NEGATIVE
Comment: NORMAL
Neisseria Gonorrhea: NEGATIVE

## 2021-09-24 ENCOUNTER — Encounter (HOSPITAL_BASED_OUTPATIENT_CLINIC_OR_DEPARTMENT_OTHER): Payer: Self-pay | Admitting: *Deleted

## 2021-09-24 ENCOUNTER — Other Ambulatory Visit: Payer: Self-pay

## 2021-09-24 ENCOUNTER — Emergency Department (HOSPITAL_BASED_OUTPATIENT_CLINIC_OR_DEPARTMENT_OTHER)
Admission: EM | Admit: 2021-09-24 | Discharge: 2021-09-24 | Disposition: A | Discharge status: Home / Self Care | Payer: Self-pay | Attending: Emergency Medicine | Admitting: Emergency Medicine

## 2021-09-24 DIAGNOSIS — M5442 Lumbago with sciatica, left side: Secondary | ICD-10-CM | POA: Insufficient documentation

## 2021-09-24 DIAGNOSIS — Z79899 Other long term (current) drug therapy: Secondary | ICD-10-CM | POA: Insufficient documentation

## 2021-09-24 DIAGNOSIS — F17210 Nicotine dependence, cigarettes, uncomplicated: Secondary | ICD-10-CM | POA: Insufficient documentation

## 2021-09-24 DIAGNOSIS — R0981 Nasal congestion: Secondary | ICD-10-CM | POA: Insufficient documentation

## 2021-09-24 DIAGNOSIS — R202 Paresthesia of skin: Secondary | ICD-10-CM | POA: Insufficient documentation

## 2021-09-24 DIAGNOSIS — M5432 Sciatica, left side: Secondary | ICD-10-CM

## 2021-09-24 DIAGNOSIS — I10 Essential (primary) hypertension: Secondary | ICD-10-CM | POA: Insufficient documentation

## 2021-09-24 DIAGNOSIS — R059 Cough, unspecified: Secondary | ICD-10-CM | POA: Insufficient documentation

## 2021-09-24 MED ORDER — HYDROMORPHONE HCL 1 MG/ML IJ SOLN
2.0000 mg | Freq: Once | INTRAMUSCULAR | Status: AC
  Administered 2021-09-24: 2 mg via INTRAMUSCULAR
  Filled 2021-09-24: qty 2

## 2021-09-24 MED ORDER — ONDANSETRON 4 MG PO TBDP
8.0000 mg | ORAL_TABLET | Freq: Once | ORAL | Status: AC
  Administered 2021-09-24: 8 mg via ORAL
  Filled 2021-09-24: qty 2

## 2021-09-24 MED ORDER — OXYCODONE-ACETAMINOPHEN 10-325 MG PO TABS
1.0000 | ORAL_TABLET | ORAL | 0 refills | Status: AC | PRN
Start: 2021-09-24 — End: ?

## 2021-09-24 MED ORDER — ONDANSETRON 8 MG PO TBDP: 8.0000 mg | ORAL_TABLET | Freq: Three times a day (TID) | ORAL | 0 refills | Status: AC | PRN

## 2021-09-24 MED ORDER — KETOROLAC TROMETHAMINE 30 MG/ML IJ SOLN
30.0000 mg | Freq: Once | INTRAMUSCULAR | Status: AC
  Administered 2021-09-24: 30 mg via INTRAMUSCULAR
  Filled 2021-09-24: qty 1

## 2021-09-24 MED ORDER — CYCLOBENZAPRINE HCL 10 MG PO TABS: 10.0000 mg | ORAL_TABLET | Freq: Three times a day (TID) | ORAL | 0 refills | Status: AC | PRN

## 2021-09-24 NOTE — ED Triage Notes (Signed)
C/o lower left back pain which radiates down left leg x 2 days
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
## 2021-09-29 ENCOUNTER — Other Ambulatory Visit (HOSPITAL_COMMUNITY): Payer: Self-pay

## 2021-09-30 ENCOUNTER — Other Ambulatory Visit (HOSPITAL_COMMUNITY): Payer: Self-pay

## 2021-10-29 ENCOUNTER — Other Ambulatory Visit (HOSPITAL_COMMUNITY): Payer: Self-pay

## 2021-11-03 ENCOUNTER — Other Ambulatory Visit (HOSPITAL_COMMUNITY): Payer: Self-pay

## 2021-11-15 ENCOUNTER — Other Ambulatory Visit: Payer: Self-pay | Admitting: Pharmacist

## 2021-11-15 DIAGNOSIS — Z79899 Other long term (current) drug therapy: Secondary | ICD-10-CM

## 2021-11-15 MED ORDER — DESCOVY 200-25 MG PO TABS: 1.0000 | ORAL_TABLET | Freq: Every day | ORAL | 0 refills | Status: DC

## 2021-11-15 MED ORDER — DESCOVY 200-25 MG PO TABS: 1.0000 | ORAL_TABLET | Freq: Every day | ORAL | 0 refills | Status: AC

## 2021-11-15 NOTE — Progress Notes (Signed)
Medication Samples have been provided to the patient.  Drug name: Descovy        Strength: 200/25 mg       Qty: 7 tablets (2 packs)   LOT: [id number]   Exp.Date: 12/09/2022  Dosing instructions: Take one tablet by mouth once daily  The patient has been instructed regarding the correct time, dose, and frequency of taking this medication, including desired effects and most common side effects.   Sofia Vanmeter L. Eber Hong, PharmD, BCIDP, AAHIVP, CPP Clinical Pharmacist Practitioner Infectious Diseases [facility] 09/21/2020, 10:07 AM

## 2021-11-16 ENCOUNTER — Other Ambulatory Visit (HOSPITAL_COMMUNITY): Payer: Self-pay

## 2021-11-19 ENCOUNTER — Telehealth: Payer: Self-pay

## 2021-11-19 NOTE — Telephone Encounter (Signed)
Requesting doctor note for 11/12/21. States he saw pharmacy. After speaking with Marchelle Folks in pharmacy she verified that patient was not seen 11/12/21 and came for samples 11/15/21. Left vm for him to call back.

## 2021-11-24 ENCOUNTER — Other Ambulatory Visit (HOSPITAL_COMMUNITY): Payer: Self-pay

## 2021-11-25 ENCOUNTER — Other Ambulatory Visit (HOSPITAL_COMMUNITY): Payer: Self-pay

## 2021-12-02 ENCOUNTER — Other Ambulatory Visit (HOSPITAL_COMMUNITY): Payer: Self-pay

## 2021-12-02 ENCOUNTER — Telehealth: Payer: Self-pay

## 2021-12-02 NOTE — Telephone Encounter (Signed)
[facility] Patient Advocate Encounter  I have been unsuccsessful in reaching patient to be able to refill medication.    Patient script will have to be filled at [facility]. [phone])  [facility] denied him and gave him his insurance information.  I called [facility] and sent up a profile for the patient.   Patient voicemail is full.   Clearance Coots, CPhT Specialty Pharmacy Patient [facility] Phone: [phone] Fax:  [phone]

## 2021-12-02 NOTE — Telephone Encounter (Signed)
[facility] Patient Advocate Encounter   Was successful in obtaining a [employer] copay card for Descovy.  This copay card will make the patients copay $0.00.  I have spoken with the patient.    The billing information is as follows and has been shared with [employer].    Clearance Coots, CPhT Specialty Pharmacy Patient [facility] for Infect[facility] Disease Phone: [phone] Fax:  [phone]

## 2021-12-13 ENCOUNTER — Other Ambulatory Visit (HOSPITAL_COMMUNITY): Payer: Self-pay

## 2021-12-14 ENCOUNTER — Telehealth: Payer: Self-pay

## 2021-12-14 NOTE — Telephone Encounter (Signed)
[facility] Patient Advocate Encounter ? ?Prior Authorization for Descovy has been approved.   ? ?PA# [id number] ?Effective dates: 12/03/21 through 12/03/22 ? ?Pharmacy will have to run claim with submission clarification code of 10. ? ?[facility] will continue to follow. ? ?Clearance Coots, CPhT ?Specialty Pharmacy Patient Advocate ?[facility] ?Phone: [phone] ?Fax:  [phone]  ?

## 2021-12-16 ENCOUNTER — Other Ambulatory Visit (HOSPITAL_COMMUNITY): Payer: Self-pay

## 2021-12-20 ENCOUNTER — Other Ambulatory Visit: Payer: Self-pay | Admitting: Internal Medicine

## 2021-12-20 DIAGNOSIS — Z7252 High risk homosexual behavior: Secondary | ICD-10-CM

## 2021-12-22 ENCOUNTER — Ambulatory Visit: Payer: Self-pay | Admitting: Pharmacist

## 2021-12-29 ENCOUNTER — Other Ambulatory Visit (HOSPITAL_COMMUNITY)
Admission: RE | Admit: 2021-12-29 | Discharge: 2021-12-29 | Disposition: A | Discharge status: Home / Self Care | Payer: Self-pay | Source: Ambulatory Visit | Attending: Infectious Disease | Admitting: Infectious Disease

## 2021-12-29 ENCOUNTER — Ambulatory Visit (INDEPENDENT_AMBULATORY_CARE_PROVIDER_SITE_OTHER): Payer: Self-pay | Admitting: Pharmacist

## 2021-12-29 ENCOUNTER — Other Ambulatory Visit: Payer: Self-pay

## 2021-12-29 DIAGNOSIS — Z79899 Other long term (current) drug therapy: Secondary | ICD-10-CM | POA: Insufficient documentation
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
## 2021-12-30 ENCOUNTER — Other Ambulatory Visit: Payer: Self-pay

## 2021-12-30 DIAGNOSIS — Z7252 High risk homosexual behavior: Secondary | ICD-10-CM

## 2021-12-30 LAB — HIV ANTIBODY (ROUTINE TESTING W REFLEX): HIV 1&2 Ab, 4th Generation: NONREACTIVE

## 2021-12-30 LAB — RPR: RPR Ser Ql: NONREACTIVE

## 2021-12-30 LAB — URINE CYTOLOGY ANCILLARY ONLY
Chlamydia: NEGATIVE
Comment: NEGATIVE
Comment: NORMAL
Neisseria Gonorrhea: NEGATIVE

## 2021-12-30 MED ORDER — DESCOVY 200-25 MG PO TABS: 1.0000 | ORAL_TABLET | Freq: Every day | ORAL | 2 refills | Status: DC

## 2021-12-31 ENCOUNTER — Other Ambulatory Visit: Payer: Self-pay | Admitting: Pharmacist

## 2021-12-31 DIAGNOSIS — Z7252 High risk homosexual behavior: Secondary | ICD-10-CM

## 2021-12-31 MED ORDER — DESCOVY 200-25 MG PO TABS: 1.0000 | ORAL_TABLET | Freq: Every day | ORAL | 2 refills | Status: DC

## 2022-01-19 ENCOUNTER — Encounter: Payer: Self-pay | Admitting: Pharmacist

## 2022-01-26 ENCOUNTER — Other Ambulatory Visit: Payer: Self-pay | Admitting: Pharmacist

## 2022-01-26 DIAGNOSIS — Z79899 Other long term (current) drug therapy: Secondary | ICD-10-CM

## 2022-01-26 MED ORDER — DESCOVY 200-25 MG PO TABS: 1.0000 | ORAL_TABLET | Freq: Every day | ORAL | 0 refills | Status: DC

## 2022-01-26 MED ORDER — DESCOVY 200-25 MG PO TABS: 1.0000 | ORAL_TABLET | Freq: Every day | ORAL | 0 refills | Status: AC

## 2022-01-26 NOTE — Addendum Note (Signed)
Addended by: Aggie Cosier L on: 01/26/2022 11:40 AM ? ? Modules accepted: Orders ? ?

## 2022-01-26 NOTE — Progress Notes (Addendum)
Medication Samples have been provided to the patient. ? ?Drug name: Descovy ?Qty: 14 tablets (2 packs)   ?LOT: [id number]   ?Exp.Date: 06/2023 ? ?Dosing instructions: Take one tablet by mouth once daily ? ?The patient has been instructed regarding the correct time, dose, and frequency of taking this medication, including desired effects and most common side effects.  ? ?Hailley Byers L. Socorro Ebron, PharmD, BCIDP, AAHIVP, CPP ?Clinical Pharmacist Practitioner ?Infectious Diseases Clinical Pharmacist ?[facility] ?09/21/2020, 10:07 AM ? ?

## 2022-03-28 ENCOUNTER — Other Ambulatory Visit: Payer: Self-pay | Admitting: Pharmacist

## 2022-03-28 DIAGNOSIS — Z7252 High risk homosexual behavior: Secondary | ICD-10-CM

## 2022-03-31 ENCOUNTER — Ambulatory Visit: Payer: Self-pay | Admitting: Pharmacist

## 2022-04-04 ENCOUNTER — Other Ambulatory Visit: Payer: Self-pay | Admitting: Pharmacist

## 2022-04-04 DIAGNOSIS — Z7252 High risk homosexual behavior: Secondary | ICD-10-CM

## 2022-04-06 ENCOUNTER — Other Ambulatory Visit: Payer: Self-pay

## 2022-04-06 ENCOUNTER — Ambulatory Visit (INDEPENDENT_AMBULATORY_CARE_PROVIDER_SITE_OTHER): Payer: 59 | Admitting: Pharmacist

## 2022-04-06 DIAGNOSIS — Z79899 Other long term (current) drug therapy: Secondary | ICD-10-CM
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
## 2022-04-07 ENCOUNTER — Other Ambulatory Visit: Payer: Self-pay | Admitting: Pharmacist

## 2022-04-07 DIAGNOSIS — Z7252 High risk homosexual behavior: Secondary | ICD-10-CM

## 2022-04-07 LAB — LIPID PANEL
Cholesterol: 178 mg/dL (ref <200)
HDL: 96 mg/dL (ref >=40)
LDL Cholesterol (Calc): 70 mg/dL (calc)
Non-HDL Cholesterol (Calc): 82 mg/dL (calc) (ref <130)
Total CHOL/HDL Ratio: 1.9 (calc) (ref <5.0)
Triglycerides: 50 mg/dL (ref <150)

## 2022-04-07 LAB — URINE CYTOLOGY ANCILLARY ONLY
Chlamydia: NEGATIVE
Comment: NEGATIVE
Comment: NORMAL
Neisseria Gonorrhea: NEGATIVE

## 2022-04-07 LAB — HEPATITIS A ANTIBODY, TOTAL: Hepatitis A AB,Total: NONREACTIVE

## 2022-04-07 LAB — RPR: RPR Ser Ql: NONREACTIVE

## 2022-04-07 LAB — HIV ANTIBODY (ROUTINE TESTING W REFLEX): HIV 1&2 Ab, 4th Generation: NONREACTIVE

## 2022-04-07 MED ORDER — DESCOVY 200-25 MG PO TABS: 1.0000 | ORAL_TABLET | Freq: Every day | ORAL | 2 refills | Status: DC

## 2022-07-05 ENCOUNTER — Other Ambulatory Visit: Payer: Self-pay

## 2022-07-05 ENCOUNTER — Ambulatory Visit (INDEPENDENT_AMBULATORY_CARE_PROVIDER_SITE_OTHER): Payer: 59 | Admitting: Pharmacist

## 2022-07-05 DIAGNOSIS — Z113 Encounter for screening for infections with a predominantly sexual mode of transmission: Secondary | ICD-10-CM

## 2022-07-05 DIAGNOSIS — Z79899 Other long term (current) drug therapy: Secondary | ICD-10-CM

## 2022-07-05 DIAGNOSIS — Z7252 High risk homosexual behavior: Secondary | ICD-10-CM

## 2022-07-05 MED ORDER — DESCOVY 200-25 MG PO TABS: 1.0000 | ORAL_TABLET | Freq: Every day | ORAL | 2 refills | Status: DC
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
## 2022-07-06 LAB — CYTOLOGY, (ORAL, ANAL, URETHRAL) ANCILLARY ONLY
Chlamydia: NEGATIVE
Comment: NEGATIVE
Comment: NORMAL
Neisseria Gonorrhea: NEGATIVE

## 2022-07-06 LAB — URINE CYTOLOGY ANCILLARY ONLY
Chlamydia: NEGATIVE
Comment: NEGATIVE
Comment: NORMAL
Neisseria Gonorrhea: NEGATIVE

## 2022-07-06 LAB — HIV ANTIBODY (ROUTINE TESTING W REFLEX): HIV 1&2 Ab, 4th Generation: NONREACTIVE

## 2022-07-06 LAB — RPR: RPR Ser Ql: NONREACTIVE

## 2022-07-18 ENCOUNTER — Emergency Department (HOSPITAL_BASED_OUTPATIENT_CLINIC_OR_DEPARTMENT_OTHER): Payer: 59

## 2022-07-18 ENCOUNTER — Encounter (HOSPITAL_BASED_OUTPATIENT_CLINIC_OR_DEPARTMENT_OTHER): Payer: Self-pay | Admitting: Pediatrics

## 2022-07-18 ENCOUNTER — Emergency Department (HOSPITAL_BASED_OUTPATIENT_CLINIC_OR_DEPARTMENT_OTHER)
Admission: EM | Admit: 2022-07-18 | Discharge: 2022-07-18 | Disposition: A | Discharge status: Home / Self Care | Payer: 59 | Attending: Emergency Medicine | Admitting: Emergency Medicine

## 2022-07-18 ENCOUNTER — Other Ambulatory Visit: Payer: Self-pay

## 2022-07-18 DIAGNOSIS — R1013 Epigastric pain: Secondary | ICD-10-CM | POA: Diagnosis present

## 2022-07-18 DIAGNOSIS — Z20822 Contact with and (suspected) exposure to covid-19: Secondary | ICD-10-CM | POA: Diagnosis not present

## 2022-07-18 DIAGNOSIS — K529 Noninfective gastroenteritis and colitis, unspecified: Secondary | ICD-10-CM | POA: Insufficient documentation

## 2022-07-18 LAB — COMPREHENSIVE METABOLIC PANEL
ALT: 24 U/L (ref 0–44)
AST: 26 U/L (ref 15–41)
Albumin: 4.2 g/dL (ref 3.5–5.0)
Alkaline Phosphatase: 56 U/L (ref 38–126)
Anion gap: 9 (ref 5–15)
BUN: 14 mg/dL (ref 6–20)
CO2: 24 mmol/L (ref 22–32)
Calcium: 8.7 mg/dL — ABNORMAL LOW (ref 8.9–10.3)
Chloride: 102 mmol/L (ref 98–111)
Creatinine, Ser: 1.04 mg/dL (ref 0.61–1.24)
GFR, Estimated: >60 mL/min (ref >60)
Glucose, Bld: 95 mg/dL (ref 70–99)
Potassium: 3.7 mmol/L (ref 3.5–5.1)
Sodium: 135 mmol/L (ref 135–145)
Total Bilirubin: 0.9 mg/dL (ref 0.3–1.2)
Total Protein: 8.1 g/dL (ref 6.5–8.1)

## 2022-07-18 LAB — CBC
HCT: 42.7 % (ref 39.0–52.0)
Hemoglobin: 14.2 g/dL (ref 13.0–17.0)
MCH: 30.9 pg (ref 26.0–34.0)
MCHC: 33.3 g/dL (ref 30.0–36.0)
MCV: 92.8 fL (ref 80.0–100.0)
Platelets: 212 10*3/uL (ref 150–400)
RBC: 4.6 MIL/uL (ref 4.22–5.81)
RDW: 12.1 % (ref 11.5–15.5)
WBC: 7.4 10*3/uL (ref 4.0–10.5)
nRBC: 0 % (ref 0.0–0.2)

## 2022-07-18 LAB — URINALYSIS, MICROSCOPIC (REFLEX): WBC, UA: NONE SEEN WBC/hpf (ref 0–5)

## 2022-07-18 LAB — URINALYSIS, ROUTINE W REFLEX MICROSCOPIC
Bilirubin Urine: NEGATIVE
Glucose, UA: NEGATIVE mg/dL
Ketones, ur: NEGATIVE mg/dL
Leukocytes,Ua: NEGATIVE
Nitrite: NEGATIVE
Protein, ur: NEGATIVE mg/dL
Specific Gravity, Urine: 1.025 (ref 1.005–1.030)
pH: 6 (ref 5.0–8.0)

## 2022-07-18 LAB — RESP PANEL BY RT-PCR (FLU A&B, COVID) ARPGX2
Influenza A by PCR: NEGATIVE
Influenza B by PCR: NEGATIVE
SARS Coronavirus 2 by RT PCR: NEGATIVE

## 2022-07-18 LAB — LIPASE, BLOOD: Lipase: 53 U/L — ABNORMAL HIGH (ref 11–51)

## 2022-07-18 MED ORDER — METHOCARBAMOL 500 MG PO TABS: 500.0000 mg | ORAL_TABLET | Freq: Three times a day (TID) | ORAL | 0 refills | Status: DC | PRN

## 2022-07-18 MED ORDER — FENTANYL CITRATE PF 50 MCG/ML IJ SOSY
100.0000 ug | PREFILLED_SYRINGE | Freq: Once | INTRAMUSCULAR | Status: AC
  Administered 2022-07-18: 100 ug via INTRAVENOUS
  Filled 2022-07-18: qty 2

## 2022-07-18 MED ORDER — METHOCARBAMOL 500 MG PO TABS: 500.0000 mg | ORAL_TABLET | Freq: Three times a day (TID) | ORAL | 0 refills | Status: AC | PRN

## 2022-07-18 MED ORDER — ONDANSETRON HCL 4 MG/2ML IJ SOLN
4.0000 mg | Freq: Once | INTRAMUSCULAR | Status: AC
  Administered 2022-07-18: 4 mg via INTRAVENOUS
  Filled 2022-07-18: qty 2

## 2022-07-18 MED ORDER — FAMOTIDINE 20 MG PO TABS: 20.0000 mg | ORAL_TABLET | Freq: Two times a day (BID) | ORAL | 0 refills | Status: AC

## 2022-07-18 MED ORDER — FAMOTIDINE 20 MG PO TABS: 20.0000 mg | ORAL_TABLET | Freq: Two times a day (BID) | ORAL | 0 refills | Status: DC

## 2022-07-18 MED ORDER — IOHEXOL 300 MG/ML  SOLN
100.0000 mL | Freq: Once | INTRAMUSCULAR | Status: AC | PRN
  Administered 2022-07-18: 100 mL via INTRAVENOUS

## 2022-07-18 MED ORDER — OMEPRAZOLE 20 MG PO CPDR: 20.0000 mg | DELAYED_RELEASE_CAPSULE | Freq: Two times a day (BID) | ORAL | 0 refills | Status: DC

## 2022-07-18 MED ORDER — ALUM & MAG HYDROXIDE-SIMETH 200-200-20 MG/5ML PO SUSP
15.0000 mL | Freq: Once | ORAL | Status: AC
  Administered 2022-07-18: 15 mL via ORAL
  Filled 2022-07-18: qty 30

## 2022-07-18 MED ORDER — ONDANSETRON HCL 4 MG PO TABS: 4.0000 mg | ORAL_TABLET | Freq: Four times a day (QID) | ORAL | 0 refills | Status: AC

## 2022-07-18 MED ORDER — OMEPRAZOLE 20 MG PO CPDR: 20.0000 mg | DELAYED_RELEASE_CAPSULE | Freq: Two times a day (BID) | ORAL | 0 refills | Status: AC

## 2022-07-18 MED ORDER — ONDANSETRON HCL 4 MG PO TABS: 4.0000 mg | ORAL_TABLET | Freq: Four times a day (QID) | ORAL | 0 refills | Status: DC

## 2022-07-18 MED ORDER — MORPHINE SULFATE (PF) 4 MG/ML IV SOLN
4.0000 mg | Freq: Once | INTRAVENOUS | Status: AC
  Administered 2022-07-18: 4 mg via INTRAVENOUS
  Filled 2022-07-18: qty 1

## 2022-07-18 MED ORDER — FAMOTIDINE 40 MG/5ML PO SUSR: 20.0000 mg | Freq: Once | ORAL | Status: DC

## 2022-07-18 NOTE — ED Provider Notes (Signed)
[facility] EMERGENCY DEPARTMENT Provider Note   CSN: [id number] Arrival date & time: 07/18/22  1615     History  Chief Complaint  Patient presents with   Emesis   Abdominal Pain    Jeremy Bell is a 41 y.o. male.  Patient is a 41 year old male present present for complaints of abdominal pain.  Patient admits to epigastric abdominal pain for the past 24 hours in the epigastric region, nonradiating, severe, constant, associated with nausea, vomiting, bloody diarrhea.  Patient admits to previous history of a perirectal abscess in 2022 that required over drainage.  Patient denies a history of gastric ulcers.  Does admit to recent Motrin 800 use for a facial abscess over the last few weeks.  Patient is  The history is provided by the patient. No language interpreter was used.  Emesis Associated symptoms: abdominal pain and diarrhea   Associated symptoms: no arthralgias, no chills, no cough, no fever and no sore throat   Abdominal Pain Associated symptoms: diarrhea, nausea and vomiting   Associated symptoms: no chest pain, no chills, no cough, no dysuria, no fever, no hematuria, no shortness of breath and no sore throat        Home Medications Prior to Admission medications   Medication Sig Start Date End Date Taking? Authorizing Provider  Biotin 10000 MCG TABS Take 10,000 mcg by mouth daily.    [provider]  cyclobenzaprine (FLEXERIL) 10 MG tablet Take 1 tablet (10 mg total) by mouth 3 (three) times daily as needed for muscle spasms. 09/24/21   Molpus, John, MD  docusate sodium (COLACE) 100 MG capsule Take 1 capsule (100 mg total) by mouth daily as needed for mild constipation. 08/10/21   Norm Parcel, PA-C  emtricitabine-tenofovir AF (DESCOVY) 200-25 MG tablet Take 1 tablet by mouth daily. 07/05/22   Kuppelweiser, Cassie L, RPH-CPP  ibuprofen (MOTRIN IB) 200 MG tablet Take 2 tablets (400 mg total) by mouth every 6 (six) hours as needed for moderate pain. 08/10/21    Norm Parcel, PA-C  Multiple Vitamin (MULTIVITAMIN) tablet Take 1 tablet by mouth daily.    [provider]  ondansetron (ZOFRAN-ODT) 8 MG disintegrating tablet Take 1 tablet (8 mg total) by mouth every 8 (eight) hours as needed for nausea or vomiting. 8mg  ODT q4 hours prn nausea 09/24/21   Molpus, John, MD  oxyCODONE-acetaminophen (PERCOCET) 10-325 MG tablet Take 1 tablet by mouth every 4 (four) hours as needed for pain. 09/24/21   Molpus, John, MD  polyethylene glycol (MIRALAX / GLYCOLAX) 17 g packet Take 17 g by mouth daily as needed for mild constipation. 08/10/21   Norm Parcel, PA-C      Allergies    Patient has no known allergies.    Review of Systems   Review of Systems  Constitutional:  Negative for chills and fever.  HENT:  Negative for ear pain and sore throat.   Eyes:  Negative for pain and visual disturbance.  Respiratory:  Negative for cough and shortness of breath.   Cardiovascular:  Negative for chest pain and palpitations.  Gastrointestinal:  Positive for abdominal pain, blood in stool, diarrhea, nausea and vomiting.  Genitourinary:  Negative for dysuria and hematuria.  Musculoskeletal:  Negative for arthralgias and back pain.  Skin:  Negative for color change and rash.  Neurological:  Negative for seizures and syncope.  All other systems reviewed and are negative.   Physical Exam Updated Vital Signs BP 131/86   Pulse 65  Temp 98.5 F (36.9 C) (Oral)   Resp 18   Ht 6\' 3"  (1.905 m)   Wt 90.7 kg   SpO2 96%   BMI 25.00 kg/m  Physical Exam Vitals and nursing note reviewed.  Constitutional:      General: He is not in acute distress.    Appearance: He is well-developed.  HENT:     Head: Normocephalic and atraumatic.  Eyes:     Conjunctiva/sclera: Conjunctivae normal.  Cardiovascular:     Rate and Rhythm: Normal rate and regular rhythm.     Heart sounds: No murmur heard. Pulmonary:     Effort: Pulmonary effort is normal. No respiratory  distress.     Breath sounds: Normal breath sounds.  Abdominal:     Palpations: Abdomen is soft.     Tenderness: There is no abdominal tenderness.  Musculoskeletal:        General: No swelling.     Cervical back: Neck supple.  Skin:    General: Skin is warm and dry.     Capillary Refill: Capillary refill takes less than 2 seconds.  Neurological:     Mental Status: He is alert.  Psychiatric:        Mood and Affect: Mood normal.     ED Results / Procedures / Treatments   Labs (all labs ordered are listed, but only abnormal results are displayed) Labs Reviewed  LIPASE, BLOOD - Abnormal; Notable for the following components:      Result Value   Lipase 53 (*)    All other components within normal limits  COMPREHENSIVE METABOLIC PANEL - Abnormal; Notable for the following components:   Calcium 8.7 (*)    All other components within normal limits  URINALYSIS, ROUTINE W REFLEX MICROSCOPIC - Abnormal; Notable for the following components:   Hgb urine dipstick TRACE (*)    All other components within normal limits  URINALYSIS, MICROSCOPIC (REFLEX) - Abnormal; Notable for the following components:   Bacteria, UA RARE (*)    All other components within normal limits  RESP PANEL BY RT-PCR (FLU A&B, COVID) ARPGX2  CBC    EKG None  Radiology CT ABDOMEN PELVIS W CONTRAST  Result Date: 07/18/2022 CLINICAL DATA:  Abdominal pain, nausea/vomiting, bloody stool EXAM: CT ABDOMEN AND PELVIS WITH CONTRAST TECHNIQUE: Multidetector CT imaging of the abdomen and pelvis was performed using the standard protocol following bolus administration of intravenous contrast. RADIATION DOSE REDUCTION: This exam was performed according to the departmental dose-optimization program which includes automated exposure control, adjustment of the mA and/or kV according to patient size and/or use of iterative reconstruction technique. CONTRAST:  09/17/2022 OMNIPAQUE IOHEXOL 300 MG/ML  SOLN COMPARISON:  08/08/2021 FINDINGS:  Lower chest: Lung bases are clear. Hepatobiliary: Liver is within normal limits. Status post cholecystectomy. No intrahepatic or extrahepatic ductal dilatation. Pancreas: Within normal limits. Spleen: Within normal limits. Adrenals/Urinary Tract: Adrenal glands are within normal limits. 17 mm right lower pole renal cyst (series 2/image 47)., measuring simple fluid density, benign (Bosniak I). Kidneys are otherwise within normal limits. No hydronephrosis. Bladder is mildly thick-walled although underdistended. Stomach/Bowel: Stomach is within normal limits. No evidence of bowel obstruction. Normal appendix (series 2/image 85). Wall thickening involving loops of ileum in the pelvis (series 2/image 78) as well as the terminal ileum (series 2/image 82), suggesting infectious/inflammatory enteritis. No pneumatosis. No drainable fluid collection/abscess. No free air. No colonic wall thickening or inflammatory changes. Vascular/Lymphatic: No evidence of abdominal aortic aneurysm. No suspicious abdominopelvic lymphadenopathy. Reproductive: Prostate is  unremarkable. Other: No abdominopelvic ascites. Musculoskeletal: Visualized osseous structures are within normal limits. IMPRESSION: Infectious/inflammatory enteritis involving the ileum, as above. No pneumatosis. No drainable fluid collection/abscess. No free air. Electronically Signed   By: Charline Bills M.D.   On: 07/18/2022 18:23    Procedures Procedures    Medications Ordered in ED Medications  famotidine (PEPCID) 40 MG/5ML suspension 20 mg (has no administration in time range)  alum & mag hydroxide-simeth (MAALOX/MYLANTA) 200-200-20 MG/5ML suspension 15 mL (has no administration in time range)  ondansetron (ZOFRAN) injection 4 mg (4 mg Intravenous Given 07/18/22 1722)  morphine (PF) 4 MG/ML injection 4 mg (4 mg Intravenous Given 07/18/22 1722)  iohexol (OMNIPAQUE) 300 MG/ML solution 100 mL (100 mLs Intravenous Contrast Given 07/18/22 1803)  fentaNYL  (SUBLIMAZE) injection 100 mcg (100 mcg Intravenous Given 07/18/22 1812)    ED Course/ Medical Decision Making/ A&P                           Medical Decision Making Amount and/or Complexity of Data Reviewed Labs: ordered. Radiology: ordered.  Risk Prescription drug management.   7:38 PM Differential diagnosis is including but not limited to bowel obstruction, perforation, diverticulitis, abscess, proctitis or colitis, gastric ulcer, etc.  Patient is a 41 year old male present present for complaints of abdominal pain.  Is alert and oriented x3, no acute distress, afebrile, stable vital signs.  Abdomen is soft with minimal tenderness to epigastric region.  No guarding, rebound, or distention.  Laboratory studies demonstrate stable liver profile, lipase, and renal function.  No leukocytosis.  No signs or symptoms of sepsis.  CT abdomen and pelvis with IV contrast demonstrates: Infectious/inflammatory enteritis involving the ileum, as above. No pneumatosis. No drainable fluid collection/abscess. No free air.  Patient given Pepcid, Maalox, medication for pain control while in ED.  Uptake ulcer is still not ruled out at this time.  Patient recommended for close follow-up with GI specialist for evaluation in the next 1 to 2 weeks if symptoms do not improve.  Prescription sent to pharmacy.  Patient in no distress and overall condition improved here in the ED. Detailed discussions were had with the patient regarding current findings, and need for close f/u with PCP or on call doctor. The patient has been instructed to return immediately if the symptoms worsen in any way for re-evaluation. Patient verbalized understanding and is in agreement with current care plan. All questions answered prior to discharge.         Final Clinical Impression(s) / ED Diagnoses Final diagnoses:  Epigastric pain  Gastroenteritis    Rx / DC Orders ED Discharge Orders     None         Franne Forts,  DO 07/18/22 1938

## 2022-07-18 NOTE — ED Triage Notes (Signed)
Reported started with green vomitus Thursday, and started to have generalized abdominal pain yesterday follow by chills, nausea and diarrhea, watery, blood tinged initially and is now darker, stated he has gone at least 10-15 times today. Reported prior surgery due to abscess formation in the stomach.

## 2022-08-01 ENCOUNTER — Other Ambulatory Visit (HOSPITAL_COMMUNITY): Payer: Self-pay

## 2022-08-10 ENCOUNTER — Other Ambulatory Visit: Payer: Self-pay | Admitting: Pharmacist

## 2022-08-10 DIAGNOSIS — Z79899 Other long term (current) drug therapy: Secondary | ICD-10-CM

## 2022-08-10 MED ORDER — DESCOVY 200-25 MG PO TABS: 1.0000 | ORAL_TABLET | Freq: Every day | ORAL | 0 refills | Status: AC

## 2022-08-10 NOTE — Progress Notes (Signed)
Medication Samples have been provided to the patient.  Drug name: Descovy     Strength: 200/25 mg    Qty: 7 tablets (1 bottles) LOT: [id number]    Exp.Date: 3/25  Dosing instructions: Take one tablet by mouth once daily.   The patient has been instructed regarding the correct time, dose, and frequency of taking this medication, including desired effects and most common side effects.   Alfonse Spruce, PharmD, CPP, BCIDP, [place] Clinical Pharmacist Practitioner [facility]

## 2022-08-11 ENCOUNTER — Other Ambulatory Visit (HOSPITAL_COMMUNITY): Payer: Self-pay

## 2022-09-20 ENCOUNTER — Other Ambulatory Visit (HOSPITAL_COMMUNITY)
Admission: RE | Admit: 2022-09-20 | Discharge: 2022-09-20 | Disposition: A | Discharge status: Home / Self Care | Payer: 59 | Source: Ambulatory Visit | Attending: Infectious Disease | Admitting: Infectious Disease

## 2022-09-20 ENCOUNTER — Other Ambulatory Visit: Payer: Self-pay

## 2022-09-20 ENCOUNTER — Ambulatory Visit (INDEPENDENT_AMBULATORY_CARE_PROVIDER_SITE_OTHER): Payer: 59 | Admitting: Pharmacist

## 2022-09-20 DIAGNOSIS — Z79899 Other long term (current) drug therapy: Secondary | ICD-10-CM | POA: Diagnosis not present

## 2022-09-20 DIAGNOSIS — Z113 Encounter for screening for infections with a predominantly sexual mode of transmission: Secondary | ICD-10-CM | POA: Diagnosis not present
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
## 2022-09-21 LAB — HIV ANTIBODY (ROUTINE TESTING W REFLEX): HIV 1&2 Ab, 4th Generation: NONREACTIVE

## 2022-09-21 LAB — CYTOLOGY, (ORAL, ANAL, URETHRAL) ANCILLARY ONLY
Chlamydia: NEGATIVE
Comment: NEGATIVE
Comment: NORMAL
Neisseria Gonorrhea: NEGATIVE

## 2022-09-21 LAB — RPR: RPR Ser Ql: NONREACTIVE

## 2022-09-21 LAB — URINE CYTOLOGY ANCILLARY ONLY
Chlamydia: NEGATIVE
Comment: NEGATIVE
Comment: NORMAL
Neisseria Gonorrhea: NEGATIVE

## 2022-09-26 ENCOUNTER — Other Ambulatory Visit: Payer: Self-pay | Admitting: Pharmacist

## 2022-09-26 DIAGNOSIS — Z79899 Other long term (current) drug therapy: Secondary | ICD-10-CM

## 2022-09-26 MED ORDER — DESCOVY 200-25 MG PO TABS: 1.0000 | ORAL_TABLET | Freq: Every day | ORAL | 2 refills | Status: DC

## 2022-09-27 ENCOUNTER — Other Ambulatory Visit: Payer: Self-pay | Admitting: Pharmacist

## 2022-12-20 ENCOUNTER — Other Ambulatory Visit (HOSPITAL_COMMUNITY): Payer: Self-pay

## 2022-12-21 ENCOUNTER — Ambulatory Visit: Payer: 59 | Admitting: Pharmacist

## 2022-12-22 ENCOUNTER — Ambulatory Visit: Payer: 59 | Admitting: Pharmacist

## 2022-12-29 ENCOUNTER — Other Ambulatory Visit: Payer: Self-pay

## 2022-12-29 ENCOUNTER — Ambulatory Visit (INDEPENDENT_AMBULATORY_CARE_PROVIDER_SITE_OTHER): Payer: 59 | Admitting: Pharmacist

## 2022-12-29 ENCOUNTER — Other Ambulatory Visit (HOSPITAL_COMMUNITY)
Admission: RE | Admit: 2022-12-29 | Discharge: 2022-12-29 | Disposition: A | Discharge status: Home / Self Care | Payer: 59 | Source: Ambulatory Visit | Attending: Infectious Disease | Admitting: Infectious Disease

## 2022-12-29 DIAGNOSIS — Z79899 Other long term (current) drug therapy: Secondary | ICD-10-CM

## 2022-12-29 DIAGNOSIS — Z113 Encounter for screening for infections with a predominantly sexual mode of transmission: Secondary | ICD-10-CM | POA: Insufficient documentation

## 2022-12-29 DIAGNOSIS — Z2981 Encounter for HIV pre-exposure prophylaxis: Secondary | ICD-10-CM
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
## 2022-12-30 ENCOUNTER — Other Ambulatory Visit: Payer: Self-pay

## 2022-12-30 DIAGNOSIS — Z79899 Other long term (current) drug therapy: Secondary | ICD-10-CM

## 2022-12-30 LAB — HIV ANTIBODY (ROUTINE TESTING W REFLEX): HIV 1&2 Ab, 4th Generation: NONREACTIVE

## 2022-12-30 LAB — RPR: RPR Ser Ql: NONREACTIVE

## 2022-12-30 MED ORDER — DESCOVY 200-25 MG PO TABS: 1.0000 | ORAL_TABLET | Freq: Every day | ORAL | 2 refills | Status: DC

## 2023-01-02 LAB — URINE CYTOLOGY ANCILLARY ONLY
Chlamydia: NEGATIVE
Comment: NEGATIVE
Comment: NORMAL
Neisseria Gonorrhea: NEGATIVE

## 2023-01-02 LAB — CYTOLOGY, (ORAL, ANAL, URETHRAL) ANCILLARY ONLY
Chlamydia: NEGATIVE
Comment: NEGATIVE
Comment: NORMAL
Neisseria Gonorrhea: NEGATIVE

## 2023-02-16 ENCOUNTER — Other Ambulatory Visit (HOSPITAL_COMMUNITY): Payer: Self-pay

## 2023-02-17 ENCOUNTER — Other Ambulatory Visit: Payer: Self-pay | Admitting: Pharmacist

## 2023-02-17 DIAGNOSIS — Z79899 Other long term (current) drug therapy: Secondary | ICD-10-CM

## 2023-02-17 MED ORDER — DESCOVY 200-25 MG PO TABS: 1.0000 | ORAL_TABLET | Freq: Every day | ORAL | 0 refills | Status: AC

## 2023-02-17 NOTE — Progress Notes (Signed)
Medication Samples have been provided to the patient.  Drug name: Dovato        Strength: 50/300 mg         Qty: 7  Tablets (1 bottles) LOT: [id number]   Exp.Date: 03/26  Dosing instructions: Take one tablet by mouth once daily  The patient has been instructed regarding the correct time, dose, and frequency of taking this medication, including desired effects and most common side effects.   Margarite Gouge, PharmD, CPP, BCIDP, AAHIVP Clinical Pharmacist Practitioner Infectious Diseases Clinical Pharmacist [facility]

## 2023-03-27 ENCOUNTER — Other Ambulatory Visit: Payer: Self-pay | Admitting: Pharmacist

## 2023-03-27 DIAGNOSIS — Z79899 Other long term (current) drug therapy: Secondary | ICD-10-CM

## 2023-03-30 ENCOUNTER — Ambulatory Visit: Payer: Self-pay | Admitting: Pharmacist

## 2023-03-30 ENCOUNTER — Other Ambulatory Visit (HOSPITAL_COMMUNITY): Payer: Self-pay

## 2023-04-11 ENCOUNTER — Other Ambulatory Visit: Payer: Self-pay | Admitting: Pharmacist

## 2023-04-11 DIAGNOSIS — Z79899 Other long term (current) drug therapy: Secondary | ICD-10-CM

## 2023-04-11 MED ORDER — DESCOVY 200-25 MG PO TABS: 1.0000 | ORAL_TABLET | Freq: Every day | ORAL | 0 refills | Status: DC

## 2023-04-11 NOTE — Progress Notes (Signed)
Patient called stating [facility] would not fill his medication. Reviewed that we denied his refill request because he needs an appointment and missed his appointment with Cassie on 03/30/23. Scheduled follow up with me next Wednesday and ordered Descovy x 30 days for him. Reviewed that he will need to call [facility] and coordinate delivery with his new address and the holiday weekend. States he now lives in [place].  Margarite Gouge, PharmD, CPP, BCIDP, AAHIVP Clinical Pharmacist Practitioner Infectious Diseases Clinical Pharmacist [facility] for Infectious Disease

## 2023-04-11 NOTE — Telephone Encounter (Signed)
error 

## 2023-04-19 ENCOUNTER — Other Ambulatory Visit: Payer: Self-pay

## 2023-04-19 ENCOUNTER — Ambulatory Visit (INDEPENDENT_AMBULATORY_CARE_PROVIDER_SITE_OTHER): Payer: 59 | Admitting: Pharmacist

## 2023-04-19 ENCOUNTER — Other Ambulatory Visit (HOSPITAL_COMMUNITY)
Admission: RE | Admit: 2023-04-19 | Discharge: 2023-04-19 | Disposition: A | Discharge status: Home / Self Care | Payer: 59 | Source: Ambulatory Visit | Attending: Infectious Disease | Admitting: Infectious Disease

## 2023-04-19 DIAGNOSIS — Z113 Encounter for screening for infections with a predominantly sexual mode of transmission: Secondary | ICD-10-CM

## 2023-04-19 DIAGNOSIS — Z79899 Other long term (current) drug therapy: Secondary | ICD-10-CM | POA: Diagnosis not present

## 2023-04-19 MED ORDER — DESCOVY 200-25 MG PO TABS: 1.0000 | ORAL_TABLET | Freq: Every day | ORAL | 0 refills | Status: AC
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
## 2023-04-19 NOTE — Progress Notes (Signed)
Medication Samples have been provided to the patient.  Drug name: Descovy     Strength: 200/25 mg    Qty: 7 tablets (1 bottles) LOT: [id number]   Exp.Date: 03/26  Dosing instructions: Take one tablet by mouth once daily.   The patient has been instructed regarding the correct time, dose, and frequency of taking this medication, including desired effects and most common side effects.   Margarite Gouge, PharmD, CPP, BCIDP, AAHIVP Clinical Pharmacist Practitioner Infectious Diseases Clinical Pharmacist [facility] for Infectious Disease

## 2023-04-20 ENCOUNTER — Other Ambulatory Visit: Payer: Self-pay | Admitting: Pharmacist

## 2023-04-20 DIAGNOSIS — Z79899 Other long term (current) drug therapy: Secondary | ICD-10-CM

## 2023-04-20 LAB — LIPID PANEL
Cholesterol: 175 mg/dL (ref <200)
HDL: 108 mg/dL (ref >=40)
LDL Cholesterol (Calc): 55 mg/dL (calc)
Non-HDL Cholesterol (Calc): 67 mg/dL (calc) (ref <130)
Total CHOL/HDL Ratio: 1.6 (calc) (ref <5.0)
Triglycerides: 41 mg/dL (ref <150)

## 2023-04-20 LAB — HIV ANTIBODY (ROUTINE TESTING W REFLEX): HIV 1&2 Ab, 4th Generation: NONREACTIVE

## 2023-04-20 LAB — RPR: RPR Ser Ql: NONREACTIVE

## 2023-04-20 MED ORDER — DESCOVY 200-25 MG PO TABS: 1.0000 | ORAL_TABLET | Freq: Every day | ORAL | 2 refills | Status: DC

## 2023-04-21 LAB — CYTOLOGY, (ORAL, ANAL, URETHRAL) ANCILLARY ONLY
Chlamydia: NEGATIVE
Comment: NEGATIVE
Comment: NORMAL
Neisseria Gonorrhea: NEGATIVE

## 2023-04-21 LAB — URINE CYTOLOGY ANCILLARY ONLY
Chlamydia: NEGATIVE
Comment: NEGATIVE
Comment: NORMAL
Neisseria Gonorrhea: NEGATIVE

## 2023-07-19 ENCOUNTER — Ambulatory Visit: Payer: 59 | Admitting: Pharmacist

## 2023-07-24 ENCOUNTER — Other Ambulatory Visit: Payer: Self-pay | Admitting: Pharmacist

## 2023-07-24 DIAGNOSIS — Z79899 Other long term (current) drug therapy: Secondary | ICD-10-CM

## 2023-07-25 NOTE — Progress Notes (Unsigned)
HPI: Jeremy Bell is a 41 y.o. male who presents to the [facility] clinic for HIV PrEP follow-up.  Insured   [x]    Uninsured  []    Patient Active Problem List   Diagnosis Date Noted   Hypokalemia 08/09/2021   Anogenital wart 08/08/2021   Essential hypertension 08/08/2021   Perirectal abscess 08/08/2021   Genital herpes 04/12/2021    Patient's Medications  New Prescriptions   No medications on file  Previous Medications   BIOTIN 40981 MCG TABS    Take 10,000 mcg by mouth daily.   CYCLOBENZAPRINE (FLEXERIL) 10 MG TABLET    Take 1 tablet (10 mg total) by mouth 3 (three) times daily as needed for muscle spasms.   DOCUSATE SODIUM (COLACE) 100 MG CAPSULE    Take 1 capsule (100 mg total) by mouth daily as needed for mild constipation.   EMTRICITABINE-TENOFOVIR AF (DESCOVY) 200-25 MG TABLET    Take 1 tablet by mouth daily.   FAMOTIDINE (PEPCID) 20 MG TABLET    Take 1 tablet (20 mg total) by mouth 2 (two) times daily.   IBUPROFEN (MOTRIN IB) 200 MG TABLET    Take 2 tablets (400 mg total) by mouth every 6 (six) hours as needed for moderate pain.   METHOCARBAMOL (ROBAXIN) 500 MG TABLET    Take 1 tablet (500 mg total) by mouth every 8 (eight) hours as needed for muscle spasms.   MULTIPLE VITAMIN (MULTIVITAMIN) TABLET    Take 1 tablet by mouth daily.   OMEPRAZOLE (PRILOSEC) 20 MG CAPSULE    Take 1 capsule (20 mg total) by mouth 2 (two) times daily before a meal for 14 days.   ONDANSETRON (ZOFRAN) 4 MG TABLET    Take 1 tablet (4 mg total) by mouth every 6 (six) hours.   ONDANSETRON (ZOFRAN-ODT) 8 MG DISINTEGRATING TABLET    Take 1 tablet (8 mg total) by mouth every 8 (eight) hours as needed for nausea or vomiting. 8mg  ODT q4 hours prn nausea   OXYCODONE-ACETAMINOPHEN (PERCOCET) 10-325 MG TABLET    Take 1 tablet by mouth every 4 (four) hours as needed for pain.   POLYETHYLENE GLYCOL (MIRALAX / GLYCOLAX) 17 G PACKET    Take 17 g by mouth daily as needed for mild constipation.  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   Gender at birth Male Male Male Male Male Male Male  Gender identity cis-Male cis-Male cis-Male cis-Male cis-Male  cis-Male  Risk for HIV  Condomless vaginal or anal intercourse;In sexual relationship with HIV+ partner;>5 partners in past 6 mos (regardless of condom use) >5 partners in past 6 mos (regardless of condom use);Hx of STI;Condomless vaginal or anal intercourse >5 partners in past 6 mos (regardless of condom use);Condomless vaginal or anal intercourse;In sexual relationship with HIV+ partner  In sexual relationship with HIV+ partner In sexual relationship with HIV+ partner  Sex Partners Men only Men only Men only Men only  Men only Men only  # sex partners past 3-6 mos  1-3 >12 >12 10-12  1-3 1-3  Sex activity preferences Insertive Insertive Insertive Insertive  Insertive Insertive  Condom use Yes No No Yes  No Yes  % condom use    90  25 54  Partners genders and ages      M 30-49 M 25-29  Treated for STI? No No No No     HIV symptoms?  N/A N/A N/A   N/A  PrEP Eligibility Substantial risk for HIV Substantial risk for HIV Substantial risk for HIV Substantial risk for HIV   HIV negative;Substantial risk for HIV  Paper work received? Yes          Labs:  SCr: Lab Results  Component Value Date   CREATININE 1.04 07/18/2022   CREATININE 1.03 08/09/2021   CREATININE 0.99 08/08/2021   CREATININE 1.10 12/17/2020   CREATININE 1.10 07/06/2020   HIV Lab Results  Component Value Date   HIV NON-REACTIVE 04/19/2023   HIV NON-REACTIVE 12/29/2022   HIV NON-REACTIVE 09/20/2022   HIV NON-REACTIVE  07/05/2022   HIV NON-REACTIVE 04/06/2022   Hepatitis B Lab Results  Component Value Date   HEPBSAB REACTIVE (A) 05/10/2018   HEPBSAG NON-REACTIVE 05/10/2018   Hepatitis C No results found for: "HEPCAB", "HCVRNAPCRQN" Hepatitis A Lab Results  Component Value Date   HAV NON-REACTIVE 04/06/2022   RPR and STI Lab Results  Component Value Date   LABRPR NON-REACTIVE 04/19/2023   LABRPR NON-REACTIVE 12/29/2022   LABRPR NON-REACTIVE 09/20/2022   LABRPR NON-REACTIVE 07/05/2022   LABRPR NON-REACTIVE 04/06/2022    STI Results GC CT  04/19/2023  3:47 PM Negative    Negative  Negative    Negative   12/29/2022  3:47 PM Negative    Negative  Negative    Negative   09/20/2022  3:30 PM Negative    Negative  Negative    Negative   07/05/2022  3:52 PM Negative    Negative  Negative    Negative   04/06/2022 10:01 AM Negative  Negative   12/29/2021 11:20 AM Negative  Negative   09/22/2021  3:29 PM Negative    Negative  Negative    Negative   06/21/2021  2:02 PM Negative  Negative   03/23/2021  5:15 PM Negative  Negative   12/17/2020 10:42 AM Negative  Negative   09/16/2020  4:09 PM Negative  Negative   07/06/2020  2:44 PM Negative  Negative   03/10/2020  3:31 PM Negative  Negative   11/13/2019  4:19 PM Negative  Negative   07/25/2019  4:00 PM Negative  Negative   03/21/2019 12:00 AM Negative  Negative   12/11/2018 12:00 AM Negative  Negative   05/10/2018 12:00 AM Negative  Negative     Assessment: Jeremy Bell is here for his 3 month PrEP follow up. Continues to take every day without any issues or problems. He last filled on 07/14/23. Screened for acute HIV symptoms such as fatigue, muscle aches, rash, sore throat, lymphadenopathy, headache, night sweats, nausea/vomiting/diarrhea, and fever. Denies any symptoms. No STI symptoms but accepts testing today. No rectal exposure so declines rectal screening. Also declines vaccines. No issues. Will check labs and see him back in 3 months.    Due for SCr check but neglected to order today. Will check in January.   Plan: - HIV antibody, RPR, urine/pharyngeal GC/CT swabs for cytology today  - Descovy x 3 months if HIV negative - Follow up with me on 10/24/23  Jeremy Bell, PharmD, BCIDP, AAHIVP, CPP  Clinical Pharmacist Practitioner Infectious Diseases Clinical Pharmacist [facility] 07/25/2023, 4:23 PM

## 2023-07-26 ENCOUNTER — Other Ambulatory Visit: Payer: Self-pay

## 2023-07-26 ENCOUNTER — Other Ambulatory Visit (HOSPITAL_COMMUNITY)
Admission: RE | Admit: 2023-07-26 | Discharge: 2023-07-26 | Disposition: A | Discharge status: Home / Self Care | Payer: 59 | Source: Ambulatory Visit | Attending: Infectious Disease | Admitting: Infectious Disease

## 2023-07-26 ENCOUNTER — Ambulatory Visit: Payer: 59 | Admitting: Pharmacist

## 2023-07-26 DIAGNOSIS — Z79899 Other long term (current) drug therapy: Secondary | ICD-10-CM

## 2023-07-26 DIAGNOSIS — Z9189 Other specified personal risk factors, not elsewhere classified: Secondary | ICD-10-CM | POA: Diagnosis not present

## 2023-07-26 DIAGNOSIS — Z113 Encounter for screening for infections with a predominantly sexual mode of transmission: Secondary | ICD-10-CM | POA: Diagnosis present

## 2023-07-27 ENCOUNTER — Other Ambulatory Visit: Payer: Self-pay | Admitting: Pharmacist

## 2023-07-27 DIAGNOSIS — Z79899 Other long term (current) drug therapy: Secondary | ICD-10-CM

## 2023-07-27 LAB — CYTOLOGY, (ORAL, ANAL, URETHRAL) ANCILLARY ONLY
Chlamydia: NEGATIVE
Comment: NEGATIVE
Comment: NORMAL
Neisseria Gonorrhea: NEGATIVE

## 2023-07-27 LAB — URINE CYTOLOGY ANCILLARY ONLY
Chlamydia: NEGATIVE
Comment: NEGATIVE
Comment: NORMAL
Neisseria Gonorrhea: NEGATIVE

## 2023-07-27 MED ORDER — DESCOVY 200-25 MG PO TABS: 1.0000 | ORAL_TABLET | Freq: Every day | ORAL | 2 refills | Status: DC

## 2023-07-28 LAB — RPR: RPR Ser Ql: NONREACTIVE

## 2023-07-28 LAB — HIV ANTIBODY (ROUTINE TESTING W REFLEX): HIV 1&2 Ab, 4th Generation: NONREACTIVE

## 2023-09-18 ENCOUNTER — Ambulatory Visit: Payer: 59 | Admitting: Pharmacist

## 2023-09-18 ENCOUNTER — Telehealth: Payer: Self-pay

## 2023-09-18 NOTE — Telephone Encounter (Signed)
Patient called office today requesting appointment for STD testing. States that he is experiencing discharge. Would like to come in today to see either provider or lab.  Call transferred to scheduling for additional assistance. Juanita Laster, RMA

## 2023-09-18 NOTE — Progress Notes (Unsigned)
   09/18/2023  HPI: Jeremy Bell is a 42 y.o. male who presents to the [facility] clinic today for STI testing.  Patient Active Problem List   Diagnosis Date Noted   Hypokalemia 08/09/2021   Anogenital wart 08/08/2021   Essential hypertension 08/08/2021   Perirectal abscess 08/08/2021   Genital herpes 04/12/2021    Patient's Medications  New Prescriptions   No medications on file  Previous Medications   BIOTIN 40981 MCG TABS    Take 10,000 mcg by mouth daily.   CYCLOBENZAPRINE (FLEXERIL) 10 MG TABLET    Take 1 tablet (10 mg total) by mouth 3 (three) times daily as needed for muscle spasms.   DOCUSATE SODIUM (COLACE) 100 MG CAPSULE    Take 1 capsule (100 mg total) by mouth daily as needed for mild constipation.   EMTRICITABINE-TENOFOVIR AF (DESCOVY) 200-25 MG TABLET    Take 1 tablet by mouth daily.   FAMOTIDINE (PEPCID) 20 MG TABLET    Take 1 tablet (20 mg total) by mouth 2 (two) times daily.   IBUPROFEN (MOTRIN IB) 200 MG TABLET    Take 2 tablets (400 mg total) by mouth every 6 (six) hours as needed for moderate pain.   METHOCARBAMOL (ROBAXIN) 500 MG TABLET    Take 1 tablet (500 mg total) by mouth every 8 (eight) hours as needed for muscle spasms.   MULTIPLE VITAMIN (MULTIVITAMIN) TABLET    Take 1 tablet by mouth daily.   OMEPRAZOLE (PRILOSEC) 20 MG CAPSULE    Take 1 capsule (20 mg total) by mouth 2 (two) times daily before a meal for 14 days.   ONDANSETRON (ZOFRAN) 4 MG TABLET    Take 1 tablet (4 mg total) by mouth every 6 (six) hours.   ONDANSETRON (ZOFRAN-ODT) 8 MG DISINTEGRATING TABLET    Take 1 tablet (8 mg total) by mouth every 8 (eight) hours as needed for nausea or vomiting. 8mg  ODT q4 hours prn nausea   OXYCODONE-ACETAMINOPHEN (PERCOCET) 10-325 MG TABLET    Take 1 tablet by mouth every 4 (four) hours as needed for pain.   POLYETHYLENE GLYCOL (MIRALAX / GLYCOLAX) 17 G PACKET    Take 17 g by mouth daily as needed for mild constipation.  Modified Medications   No medications on file   Discontinued Medications   No medications on file    Assessment: ***  Plan: - STI screening: RPR, urine/rectal/pharyngeal GC/CT swabs for cytology today - F/u results to see if treatment is needed  Merik Mignano L. Jannette Fogo, PharmD, BCIDP, AAHIVP, CPP Clinical Pharmacist Practitioner Infectious Diseases Clinical Pharmacist [facility] 09/18/2023, 10:05 AM

## 2023-10-04 IMAGING — CT CT ABD-PELV W/ CM
2 of 4 series · 15 of 46 positions shown, 17 images · IV contrast (Omnipaque)
Comparison: Abdominopelvic CT 03/13/2014, pelvis CT 10/19/2015

CLINICAL DATA: Abdominal pain, acute. Rectal pain. Gluteal swelling
and fever. Concern for abscess. Requested scan to the scrotum.

EXAM:
CT ABDOMEN AND PELVIS WITH CONTRAST
TECHNIQUE: Multidetector CT imaging of the abdomen and pelvis was performed
using the standard protocol following bolus administration of
intravenous contrast.
CONTRAST:  100mL OMNIPAQUE IOHEXOL 300 MG/ML  SOLN

[Series 2: axial st · axial · 0.93mm/px · z∈[-1001,-491]mm · 12 of 112 slices shown, 14 images]
[im 5/112  soft-tissue]
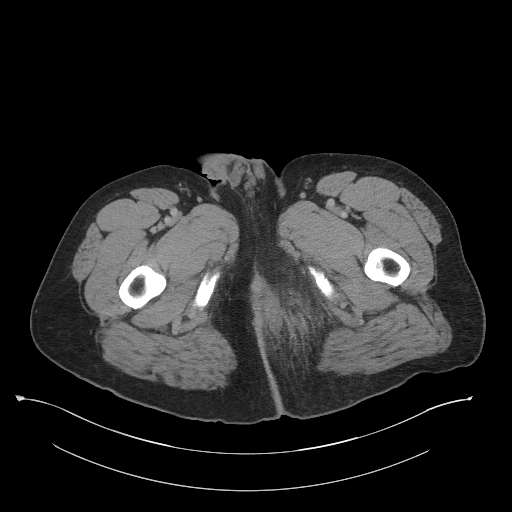
[im 5/112  bone]
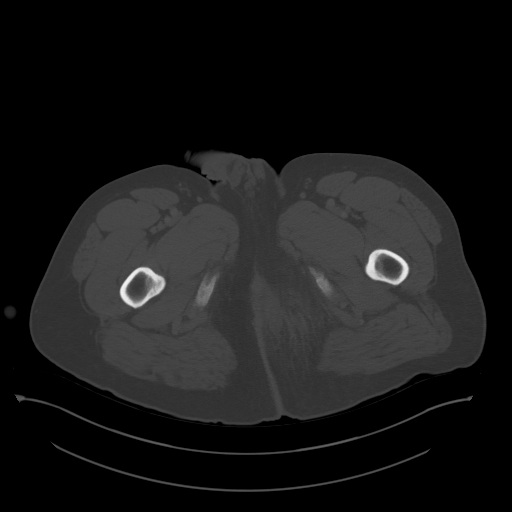
[im 14/112  soft-tissue]
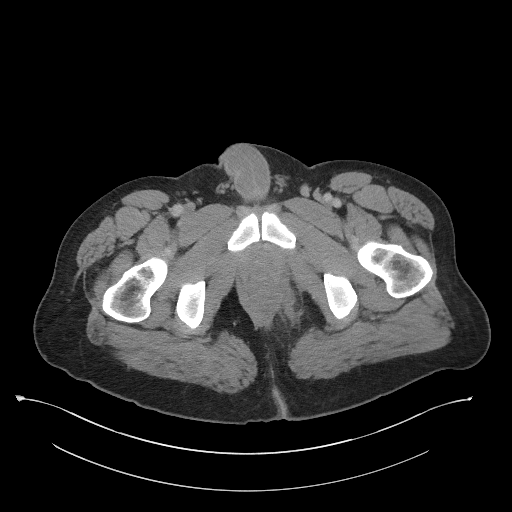
[im 24/112  soft-tissue]
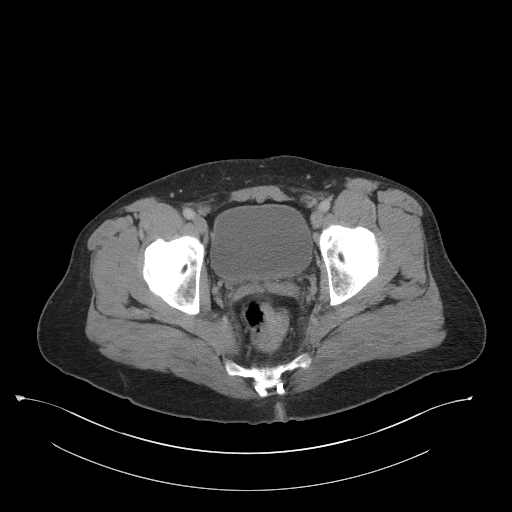
[im 33/112  soft-tissue]
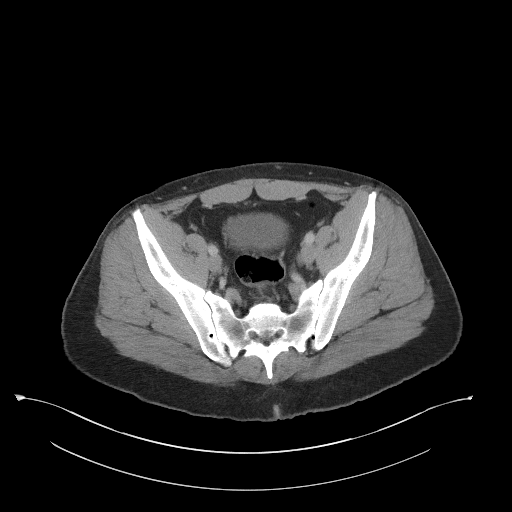
[im 42/112  soft-tissue]
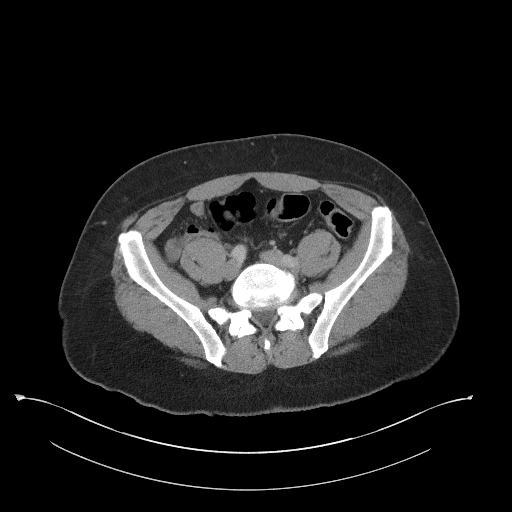
[im 51/112  soft-tissue]
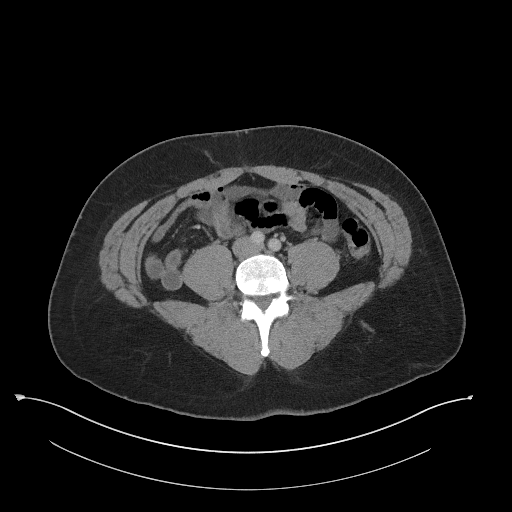
[im 61/112  soft-tissue]
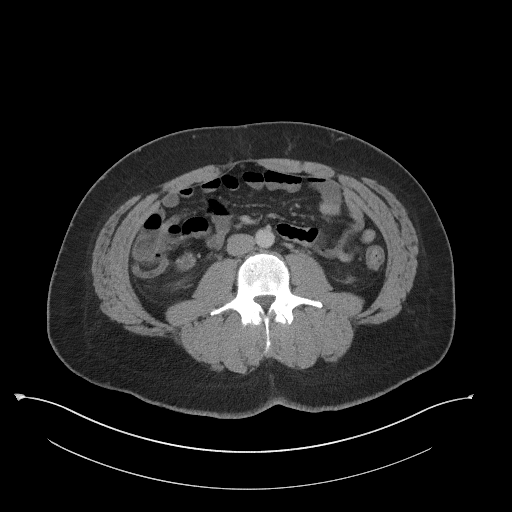
[im 70/112  soft-tissue]
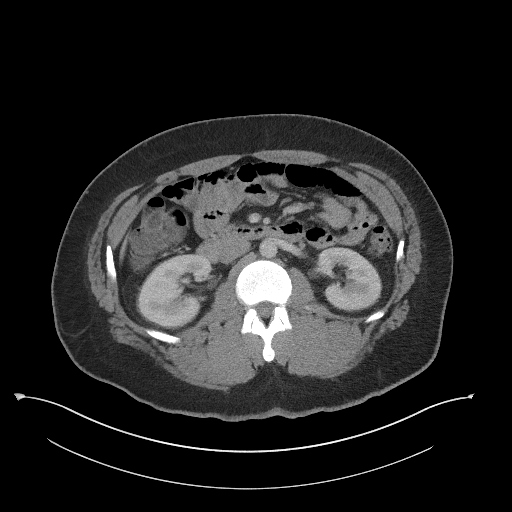
[im 79/112  soft-tissue]
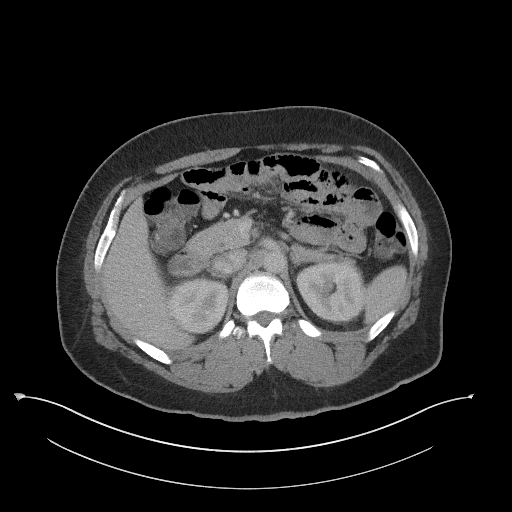
[im 79/112  bone]
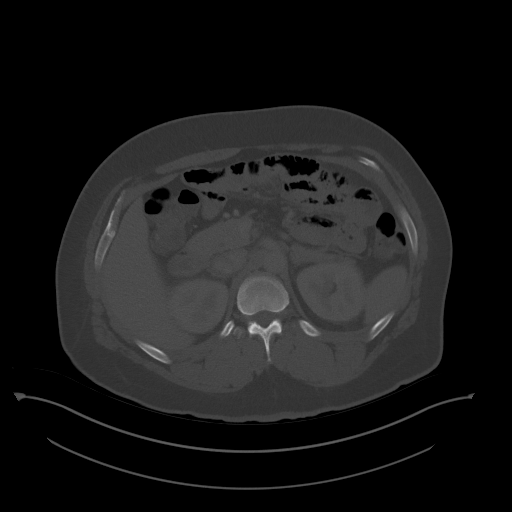
[im 88/112  soft-tissue]
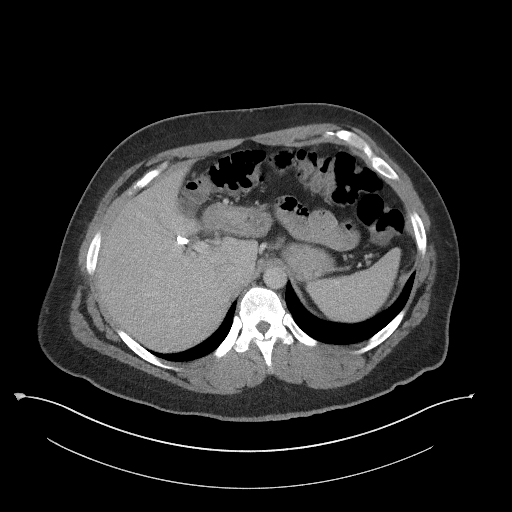
[im 98/112  soft-tissue]
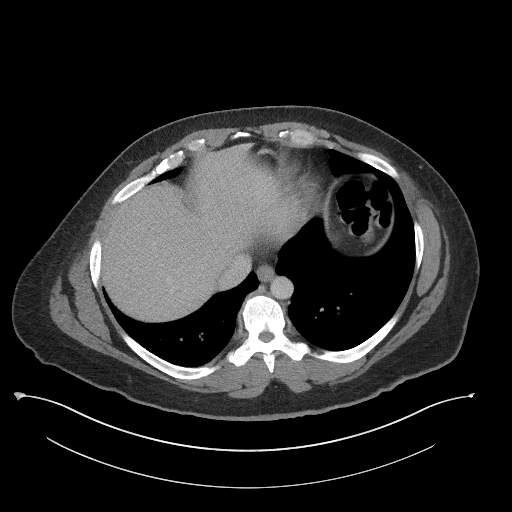
[im 107/112  soft-tissue]
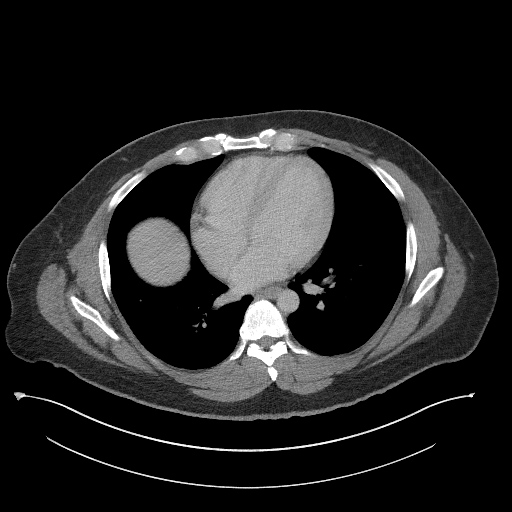

[Series 5: coronal st · coronal · 0.92mm/px · 3 of 88 slices shown]
[im 30/88  soft-tissue]
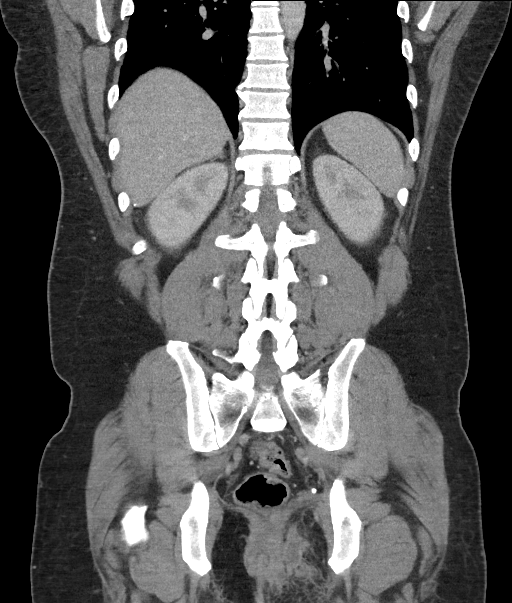
[im 39/88  soft-tissue]
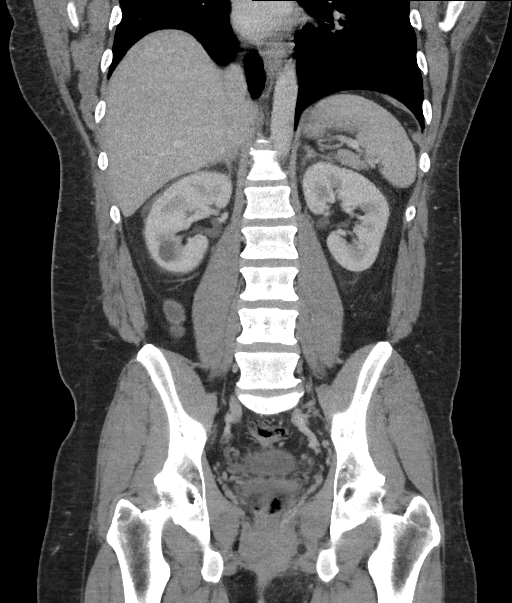
[im 49/88  soft-tissue]
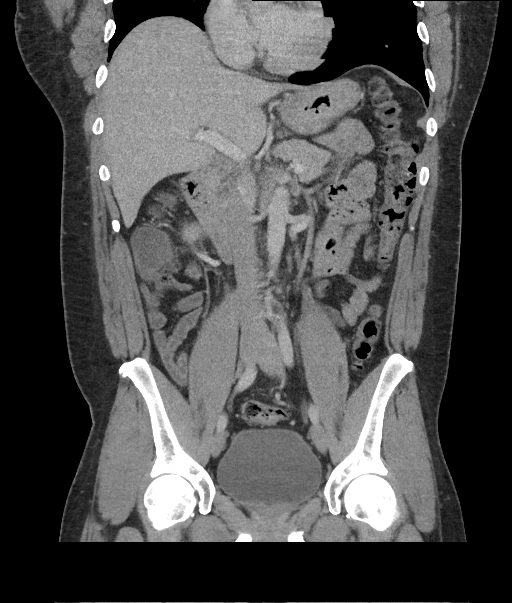

[15 of 46 positions shown; findings below may reference images not displayed]

FINDINGS: Lower chest: No pleural effusion or acute airspace disease. The
heart is normal in size.

Hepatobiliary: No focal liver abnormality is seen. Status post
cholecystectomy. No biliary dilatation.

Pancreas: Unremarkable. No pancreatic ductal dilatation or
surrounding inflammatory changes.

Spleen: Normal in size without focal abnormality.

Adrenals/Urinary Tract: Normal adrenal glands. There is mild right
perinephric edema and trace amount of perinephric fluid. Slight
heterogeneous right renal enhancement. Low-density lesion in the
lower right kidney is consistent with cyst, and unchanged from prior
exam. There is no hydronephrosis. No renal or ureteral calculi.
Normal appearance of the left kidney. Unremarkable urinary bladder

Stomach/Bowel: The stomach is nondistended. Occasional fluid-filled
loops of small bowel without obstruction, inflammation, or abnormal
distention. Normal appendix. Small volume of stool throughout the
colon.

Left perirectal fluid collection measures 2.5 x 1.9 x 3.1 cm, for
example series 2, image 104. There is peripheral enhancement in
adjacent inflammation consistent with abscess. Fat stranding and
inflammatory changes extend in the left gluteal soft tissues. There
is no internal or soft tissue air. No inflammatory extension to the
included perineum anteriorly.

Vascular/Lymphatic: Few prominent left inguinal nodes are likely
reactive. No abdominal adenopathy. Normal caliber abdominal aorta.
Patent portal vein.

Reproductive: Prostate is unremarkable. The scrotum is not entirely
included in the field of view.

Other: No ascites or free air. Tiny fat containing umbilical hernia.
Other than perirectal abscess, no fluid collection.

Musculoskeletal: Mild L5-S1 degenerative disc disease. There are no
acute or suspicious osseous abnormalities.
IMPRESSION: 1. Left perirectal abscess measuring 2.5 x 1.9 x 3.1 cm. Fat
stranding and inflammatory changes extend in the left gluteal soft
tissues. No internal or soft tissue air.
2. Mild right perinephric edema and trace amount of perinephric
fluid, can be seen with urinary tract infection. Recommend
correlation with urinalysis.

## 2023-10-23 NOTE — Progress Notes (Signed)
 HPI: Jeremy Bell is a 43 y.o. male who presents to the [facility] clinic for HIV PrEP follow-up.  Insured   [x]    Uninsured  []    Patient Active Problem List   Diagnosis Date Noted   Hypokalemia 08/09/2021   Anogenital wart 08/08/2021   Essential hypertension 08/08/2021   Perirectal abscess 08/08/2021   Genital herpes 04/12/2021    Patient's Medications  New Prescriptions   No medications on file  Previous Medications   BIOTIN 89999 MCG TABS    Take 10,000 mcg by mouth daily.   CYCLOBENZAPRINE  (FLEXERIL ) 10 MG TABLET    Take 1 tablet (10 mg total) by mouth 3 (three) times daily as needed for muscle spasms.   DOCUSATE SODIUM  (COLACE) 100 MG CAPSULE    Take 1 capsule (100 mg total) by mouth daily as needed for mild constipation.   EMTRICITABINE -TENOFOVIR  AF (DESCOVY ) 200-25 MG TABLET    Take 1 tablet by mouth daily.   FAMOTIDINE  (PEPCID ) 20 MG TABLET    Take 1 tablet (20 mg total) by mouth 2 (two) times daily.   IBUPROFEN  (MOTRIN  IB) 200 MG TABLET    Take 2 tablets (400 mg total) by mouth every 6 (six) hours as needed for moderate pain.   METHOCARBAMOL  (ROBAXIN ) 500 MG TABLET    Take 1 tablet (500 mg total) by mouth every 8 (eight) hours as needed for muscle spasms.   MULTIPLE VITAMIN (MULTIVITAMIN) TABLET    Take 1 tablet by mouth daily.   OMEPRAZOLE  (PRILOSEC) 20 MG CAPSULE    Take 1 capsule (20 mg total) by mouth 2 (two) times daily before a meal for 14 days.   ONDANSETRON  (ZOFRAN ) 4 MG TABLET    Take 1 tablet (4 mg total) by mouth every 6 (six) hours.   ONDANSETRON  (ZOFRAN -ODT) 8 MG DISINTEGRATING TABLET    Take 1 tablet (8 mg total) by mouth every 8 (eight) hours as needed for nausea or vomiting. 8mg  ODT q4 hours prn nausea   OXYCODONE -ACETAMINOPHEN  (PERCOCET) 10-325 MG TABLET    Take 1 tablet by mouth every 4 (four) hours as needed for pain.   POLYETHYLENE GLYCOL (MIRALAX  / GLYCOLAX ) 17 G PACKET    Take 17 g by mouth daily as needed for mild constipation.  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    Gender at birth Male Male Male Male Male Male  Male  Gender identity cis-Male cis-Male cis-Male cis-Male cis-Male  cis-Male  Risk for HIV  Condomless vaginal or anal intercourse;In sexual relationship with HIV+ partner;>5 partners in past 6 mos (regardless of condom use) >5 partners in past 6 mos (regardless of condom use);Hx of STI;Condomless vaginal or anal intercourse >5 partners in past 6 mos (regardless of condom use);Condomless vaginal or anal intercourse;In sexual relationship with HIV+ partner  In sexual relationship with HIV+ partner  In sexual relationship with HIV+ partner  Sex Partners Men only Men only Men only Men only  Men only  Men only  #  sex partners past 3-6 mos 1-3 >12 >12 10-12  1-3  1-3  Sex activity preferences Insertive Insertive Insertive Insertive  Insertive  Insertive  Condom use Yes No No Yes  No  Yes  % condom use    19  25  46  Partners genders and ages      M 30-49  M 25-29  Treated for STI? No No No No     HIV symptoms?  N/A N/A N/A   N/A  PrEP Eligibility Substantial risk for HIV Substantial risk for HIV Substantial risk for HIV Substantial risk for HIV   HIV negative;Substantial risk for HIV  Paper work received? Yes           Proxy-reported    Labs:  SCr: Lab Results  Component Value Date   CREATININE 1.04 07/18/2022   CREATININE 1.03 08/09/2021   CREATININE 0.99 08/08/2021   CREATININE 1.10 12/17/2020   CREATININE 1.10 07/06/2020   HIV Lab Results  Component Value Date   HIV NON-REACTIVE 07/26/2023   HIV NON-REACTIVE 04/19/2023   HIV NON-REACTIVE  12/29/2022   HIV NON-REACTIVE 09/20/2022   HIV NON-REACTIVE 07/05/2022   Hepatitis B Lab Results  Component Value Date   HEPBSAB REACTIVE (A) 05/10/2018   HEPBSAG NON-REACTIVE 05/10/2018   Hepatitis C No results found for: HEPCAB, HCVRNAPCRQN Hepatitis A Lab Results  Component Value Date   HAV NON-REACTIVE 04/06/2022   RPR and STI Lab Results  Component Value Date   LABRPR NON-REACTIVE 07/26/2023   LABRPR NON-REACTIVE 04/19/2023   LABRPR NON-REACTIVE 12/29/2022   LABRPR NON-REACTIVE 09/20/2022   LABRPR NON-REACTIVE 07/05/2022    STI Results GC CT  07/26/2023  3:41 PM Negative    Negative  Negative    Negative   04/19/2023  3:47 PM Negative    Negative  Negative    Negative   12/29/2022  3:47 PM Negative    Negative  Negative    Negative   09/20/2022  3:30 PM Negative    Negative  Negative    Negative   07/05/2022  3:52 PM Negative    Negative  Negative    Negative   04/06/2022 10:01 AM Negative  Negative   12/29/2021 11:20 AM Negative  Negative   09/22/2021  3:29 PM Negative    Negative  Negative    Negative   06/21/2021  2:02 PM Negative  Negative   03/23/2021  5:15 PM Negative  Negative   12/17/2020 10:42 AM Negative  Negative   09/16/2020  4:09 PM Negative  Negative   07/06/2020  2:44 PM Negative  Negative   03/10/2020  3:31 PM Negative  Negative   11/13/2019  4:19 PM Negative  Negative   07/25/2019  4:00 PM Negative  Negative   03/21/2019 12:00 AM Negative  Negative   12/11/2018 12:00 AM Negative  Negative   05/10/2018 12:00 AM Negative  Negative     Assessment: Jeremy Bell presents today for HIV PrEP follow up. Continues to take every day without any issues or problems. Unfortunately we missed Jeremy Bell last month, but he has a history of good adherence. Discussed the importance of adherence when it comes to HIV resistance if HIV transmission occurs. Screened for acute HIV symptoms such as fatigue, muscle aches, rash, sore throat, lymphadenopathy,  headache, night sweats, nausea/vomiting/diarrhea, and fever. Denies any symptoms. No STI symptoms but accepts testing today. No rectal exposure so declines rectal screening. Patient historically declines vaccines. Note he is due for HAV, HPV, COVID, and  MPX vaccines. Declines again today. No issues. Will update SCr labs and see him back in 3 months.   Plan: - HIV ab, BMP today - Descovy  refills to pharmacy when HIV ab negative - Follow up with Cassie on 01/24/24  Con Laughter, PharmD PGY-2 Infectious Diseases Pharmacy Resident [facility]

## 2023-10-24 ENCOUNTER — Ambulatory Visit (INDEPENDENT_AMBULATORY_CARE_PROVIDER_SITE_OTHER): Payer: 59 | Admitting: Pharmacist

## 2023-10-24 ENCOUNTER — Other Ambulatory Visit: Payer: Self-pay

## 2023-10-24 DIAGNOSIS — Z113 Encounter for screening for infections with a predominantly sexual mode of transmission: Secondary | ICD-10-CM

## 2023-10-24 DIAGNOSIS — Z2981 Encounter for HIV pre-exposure prophylaxis: Secondary | ICD-10-CM | POA: Diagnosis not present

## 2023-10-25 LAB — GC/CHLAMYDIA PROBE, AMP (THROAT)
Chlamydia trachomatis RNA: NOT DETECTED
Neisseria gonorrhoeae RNA: NOT DETECTED

## 2023-10-25 LAB — BASIC METABOLIC PANEL
BUN: 13 mg/dL (ref 7–25)
CO2: 27 mmol/L (ref 20–32)
Calcium: 9.2 mg/dL (ref 8.6–10.3)
Chloride: 102 mmol/L (ref 98–110)
Creat: 0.97 mg/dL (ref 0.60–1.29)
Glucose, Bld: 98 mg/dL (ref 65–99)
Potassium: 3.9 mmol/L (ref 3.5–5.3)
Sodium: 137 mmol/L (ref 135–146)

## 2023-10-25 LAB — C. TRACHOMATIS/N. GONORRHOEAE RNA
C. trachomatis RNA, TMA: NOT DETECTED
N. gonorrhoeae RNA, TMA: NOT DETECTED

## 2023-10-25 LAB — RPR: RPR Ser Ql: NONREACTIVE

## 2023-10-25 LAB — HIV ANTIBODY (ROUTINE TESTING W REFLEX): HIV 1&2 Ab, 4th Generation: NONREACTIVE

## 2023-10-26 ENCOUNTER — Other Ambulatory Visit: Payer: Self-pay

## 2023-10-26 DIAGNOSIS — Z79899 Other long term (current) drug therapy: Secondary | ICD-10-CM

## 2023-10-26 MED ORDER — DESCOVY 200-25 MG PO TABS: 1.0000 | ORAL_TABLET | Freq: Every day | ORAL | 2 refills | Status: DC

## 2023-10-26 NOTE — Progress Notes (Signed)
Rx initially sent to [facility] (previous pharmay). Canceled RX (via phone), and resent to [facility].

## 2024-01-01 ENCOUNTER — Other Ambulatory Visit: Payer: Self-pay | Admitting: Pharmacist

## 2024-01-01 ENCOUNTER — Other Ambulatory Visit (HOSPITAL_COMMUNITY): Payer: Self-pay

## 2024-01-01 DIAGNOSIS — Z79899 Other long term (current) drug therapy: Secondary | ICD-10-CM

## 2024-01-01 MED ORDER — DESCOVY 200-25 MG PO TABS: 1.0000 | ORAL_TABLET | Freq: Every day | ORAL | 0 refills | Status: DC

## 2024-01-02 ENCOUNTER — Other Ambulatory Visit (HOSPITAL_COMMUNITY): Payer: Self-pay

## 2024-01-24 ENCOUNTER — Ambulatory Visit: Payer: 59 | Admitting: Pharmacist

## 2024-01-28 NOTE — Progress Notes (Unsigned)
 HPI: Jeremy Bell is a 43 y.o. male who presents to the [facility] pharmacy clinic for HIV PrEP follow-up.  Insured   [x]    Uninsured  []    Patient Active Problem List   Diagnosis Date Noted   Hypokalemia 08/09/2021   Anogenital wart 08/08/2021   Essential hypertension 08/08/2021   Perirectal abscess 08/08/2021   Genital herpes 04/12/2021    Patient's Medications  New Prescriptions   No medications on file  Previous Medications   BIOTIN 16109 MCG TABS    Take 10,000 mcg by mouth daily.   CYCLOBENZAPRINE  (FLEXERIL ) 10 MG TABLET    Take 1 tablet (10 mg total) by mouth 3 (three) times daily as needed for muscle spasms.   DOCUSATE SODIUM  (COLACE) 100 MG CAPSULE    Take 1 capsule (100 mg total) by mouth daily as needed for mild constipation.   EMTRICITABINE -TENOFOVIR  AF (DESCOVY ) 200-25 MG TABLET    Take 1 tablet by mouth daily.   FAMOTIDINE  (PEPCID ) 20 MG TABLET    Take 1 tablet (20 mg total) by mouth 2 (two) times daily.   IBUPROFEN  (MOTRIN  IB) 200 MG TABLET    Take 2 tablets (400 mg total) by mouth every 6 (six) hours as needed for moderate pain.   METHOCARBAMOL  (ROBAXIN ) 500 MG TABLET    Take 1 tablet (500 mg total) by mouth every 8 (eight) hours as needed for muscle spasms.   MULTIPLE VITAMIN (MULTIVITAMIN) TABLET    Take 1 tablet by mouth daily.   OMEPRAZOLE  (PRILOSEC) 20 MG CAPSULE    Take 1 capsule (20 mg total) by mouth 2 (two) times daily before a meal for 14 days.   ONDANSETRON  (ZOFRAN ) 4 MG TABLET    Take 1 tablet (4 mg total) by mouth every 6 (six) hours.   ONDANSETRON  (ZOFRAN -ODT) 8 MG DISINTEGRATING TABLET    Take 1 tablet (8 mg total) by mouth every 8 (eight) hours as needed for nausea or vomiting. 8mg  ODT q4 hours prn nausea   OXYCODONE -ACETAMINOPHEN  (PERCOCET) 10-325 MG TABLET    Take 1 tablet by mouth every 4 (four) hours as needed for pain.   POLYETHYLENE GLYCOL (MIRALAX  / GLYCOLAX ) 17 G PACKET    Take 17 g by mouth daily as needed for mild constipation.  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    Gender at birth Male Male Male Male Male Male  Male  Gender identity cis-Male cis-Male cis-Male cis-Male cis-Male  cis-Male  Risk for HIV  Condomless vaginal or anal intercourse;In sexual relationship with HIV+ partner;>5 partners in past 6 mos (regardless of condom use) >5 partners in past 6 mos (regardless of condom use);Hx of STI;Condomless vaginal or anal intercourse >5 partners in past 6 mos (regardless of condom use);Condomless vaginal or anal intercourse;In sexual relationship with HIV+ partner  In sexual relationship with HIV+ partner  In sexual relationship with HIV+ partner  Sex Partners Men only Men only Men only Men only  Men only  Men only  #  sex partners past 3-6 mos 1-3 >12 >12 10-12  1-3  1-3  Sex activity preferences Insertive Insertive Insertive Insertive  Insertive  Insertive  Condom use Yes No No Yes  No  Yes  % condom use    21  25  80  Partners genders and ages      M 30-49  M 25-29  Treated for STI? No No No No     HIV symptoms?  N/A N/A N/A   N/A  PrEP Eligibility Substantial risk for HIV Substantial risk for HIV Substantial risk for HIV Substantial risk for HIV   HIV negative;Substantial risk for HIV  Paper work received? Yes           Proxy-reported    Labs:  SCr: Lab Results  Component Value Date   CREATININE 0.97 10/24/2023   CREATININE 1.04 07/18/2022   CREATININE 1.03 08/09/2021   CREATININE 0.99 08/08/2021   CREATININE 1.10 12/17/2020   HIV Lab Results  Component Value Date   HIV NON-REACTIVE 10/24/2023   HIV NON-REACTIVE 07/26/2023   HIV NON-REACTIVE  04/19/2023   HIV NON-REACTIVE 12/29/2022   HIV NON-REACTIVE 09/20/2022   Hepatitis B Lab Results  Component Value Date   HEPBSAB REACTIVE (A) 05/10/2018   HEPBSAG NON-REACTIVE 05/10/2018   Hepatitis C No results found for: "HEPCAB", "HCVRNAPCRQN" Hepatitis A Lab Results  Component Value Date   HAV NON-REACTIVE 04/06/2022   RPR and STI Lab Results  Component Value Date   LABRPR NON-REACTIVE 10/24/2023   LABRPR NON-REACTIVE 07/26/2023   LABRPR NON-REACTIVE 04/19/2023   LABRPR NON-REACTIVE 12/29/2022   LABRPR NON-REACTIVE 09/20/2022    STI Results GC CT  07/26/2023  3:41 PM Negative    Negative  Negative    Negative   04/19/2023  3:47 PM Negative    Negative  Negative    Negative   12/29/2022  3:47 PM Negative    Negative  Negative    Negative   09/20/2022  3:30 PM Negative    Negative  Negative    Negative   07/05/2022  3:52 PM Negative    Negative  Negative    Negative   04/06/2022 10:01 AM Negative  Negative   12/29/2021 11:20 AM Negative  Negative   09/22/2021  3:29 PM Negative    Negative  Negative    Negative   06/21/2021  2:02 PM Negative  Negative   03/23/2021  5:15 PM Negative  Negative   12/17/2020 10:42 AM Negative  Negative   09/16/2020  4:09 PM Negative  Negative   07/06/2020  2:44 PM Negative  Negative   03/10/2020  3:31 PM Negative  Negative   11/13/2019  4:19 PM Negative  Negative   07/25/2019  4:00 PM Negative  Negative   03/21/2019 12:00 AM Negative  Negative   12/11/2018 12:00 AM Negative  Negative   05/10/2018 12:00 AM Negative  Negative     Assessment: Burech is here for 44-month follow-up for HIV PrEP on Descovy . Last visit January 2025, CMP at that visit normal. Last STI testing for chlamydia, gonorrhea, syphilis negative in January 2025. *** STI testing today.  Due for HAV, HPV, and Covid vaccinations. *** vaccines today.   Screened for acute HIV symptoms such as fatigue, muscle aches, rash, sore throat, lymphadenopathy, headache,  night sweats, nausea/vomiting/diarrhea, and fever. Denies any symptoms.    Plan: - HIV antibody today - ***STI testing - vaccinations given:  ***

## 2024-01-29 ENCOUNTER — Ambulatory Visit: Admitting: Pharmacist

## 2024-01-29 DIAGNOSIS — Z79899 Other long term (current) drug therapy: Secondary | ICD-10-CM

## 2024-02-13 ENCOUNTER — Other Ambulatory Visit (HOSPITAL_COMMUNITY): Payer: Self-pay

## 2024-02-13 NOTE — Progress Notes (Unsigned)
 HPI: Jeremy Bell is a 43 y.o. male who presents to the [facility] pharmacy clinic to discuss and initiate PrEP.  Insured   [x]    Uninsured  []    Patient Active Problem List   Diagnosis Date Noted   Hypokalemia 08/09/2021   Anogenital wart 08/08/2021   Essential hypertension 08/08/2021   Perirectal abscess 08/08/2021   Genital herpes 04/12/2021    Patient's Medications  New Prescriptions   No medications on file  Previous Medications   BIOTIN 56213 MCG TABS    Take 10,000 mcg by mouth daily.   CYCLOBENZAPRINE  (FLEXERIL ) 10 MG TABLET    Take 1 tablet (10 mg total) by mouth 3 (three) times daily as needed for muscle spasms.   DOCUSATE SODIUM  (COLACE) 100 MG CAPSULE    Take 1 capsule (100 mg total) by mouth daily as needed for mild constipation.   EMTRICITABINE -TENOFOVIR  AF (DESCOVY ) 200-25 MG TABLET    Take 1 tablet by mouth daily.   FAMOTIDINE  (PEPCID ) 20 MG TABLET    Take 1 tablet (20 mg total) by mouth 2 (two) times daily.   IBUPROFEN  (MOTRIN  IB) 200 MG TABLET    Take 2 tablets (400 mg total) by mouth every 6 (six) hours as needed for moderate pain.   METHOCARBAMOL  (ROBAXIN ) 500 MG TABLET    Take 1 tablet (500 mg total) by mouth every 8 (eight) hours as needed for muscle spasms.   MULTIPLE VITAMIN (MULTIVITAMIN) TABLET    Take 1 tablet by mouth daily.   OMEPRAZOLE  (PRILOSEC) 20 MG CAPSULE    Take 1 capsule (20 mg total) by mouth 2 (two) times daily before a meal for 14 days.   ONDANSETRON  (ZOFRAN ) 4 MG TABLET    Take 1 tablet (4 mg total) by mouth every 6 (six) hours.   ONDANSETRON  (ZOFRAN -ODT) 8 MG DISINTEGRATING TABLET    Take 1 tablet (8 mg total) by mouth every 8 (eight) hours as needed for nausea or vomiting. 8mg  ODT q4 hours prn nausea   OXYCODONE -ACETAMINOPHEN  (PERCOCET) 10-325 MG TABLET    Take 1 tablet by mouth every 4 (four) hours as needed for pain.   POLYETHYLENE GLYCOL (MIRALAX  / GLYCOLAX ) 17 G PACKET    Take 17 g by mouth daily as needed for mild constipation.  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    Gender at birth Male Male Male Male Male Male  Male  Gender identity cis-Male cis-Male cis-Male cis-Male cis-Male  cis-Male  Risk for HIV  Condomless vaginal or anal intercourse;In sexual relationship with HIV+ partner;>5 partners in past 6 mos (regardless of condom use) >5 partners in past 6 mos (regardless of condom use);Hx of STI;Condomless vaginal or anal intercourse >5 partners in past 6 mos (regardless of condom use);Condomless vaginal or anal intercourse;In sexual relationship with HIV+ partner  In sexual relationship with HIV+ partner  In sexual relationship with HIV+ partner  Sex Partners Men only Men only Men only Men only  Men only  Men only  #  sex partners past 3-6 mos 1-3 >12 >12 10-12  1-3  1-3  Sex activity preferences Insertive Insertive Insertive Insertive  Insertive  Insertive  Condom use Yes No No Yes  No  Yes  % condom use    34  25  56  Partners genders and ages      M 30-49  M 25-29  Treated for STI? No No No No     HIV symptoms?  N/A N/A N/A   N/A  PrEP Eligibility Substantial risk for HIV Substantial risk for HIV Substantial risk for HIV Substantial risk for HIV   HIV negative;Substantial risk for HIV  Paper work received? Yes           Proxy-reported    Labs:  SCr: Lab Results  Component Value Date   CREATININE 0.97 10/24/2023   CREATININE 1.04 07/18/2022   CREATININE 1.03 08/09/2021   CREATININE 0.99 08/08/2021   CREATININE 1.10 12/17/2020   HIV Lab Results  Component Value Date   HIV NON-REACTIVE 10/24/2023   HIV NON-REACTIVE 07/26/2023   HIV NON-REACTIVE  04/19/2023   HIV NON-REACTIVE 12/29/2022   HIV NON-REACTIVE 09/20/2022   Hepatitis B Lab Results  Component Value Date   HEPBSAB REACTIVE (A) 05/10/2018   HEPBSAG NON-REACTIVE 05/10/2018   Hepatitis C No results found for: "HEPCAB", "HCVRNAPCRQN" Hepatitis A Lab Results  Component Value Date   HAV NON-REACTIVE 04/06/2022   RPR and STI Lab Results  Component Value Date   LABRPR NON-REACTIVE 10/24/2023   LABRPR NON-REACTIVE 07/26/2023   LABRPR NON-REACTIVE 04/19/2023   LABRPR NON-REACTIVE 12/29/2022   LABRPR NON-REACTIVE 09/20/2022    STI Results GC CT  07/26/2023  3:41 PM Negative    Negative  Negative    Negative   04/19/2023  3:47 PM Negative    Negative  Negative    Negative   12/29/2022  3:47 PM Negative    Negative  Negative    Negative   09/20/2022  3:30 PM Negative    Negative  Negative    Negative   07/05/2022  3:52 PM Negative    Negative  Negative    Negative   04/06/2022 10:01 AM Negative  Negative   12/29/2021 11:20 AM Negative  Negative   09/22/2021  3:29 PM Negative    Negative  Negative    Negative   06/21/2021  2:02 PM Negative  Negative   03/23/2021  5:15 PM Negative  Negative   12/17/2020 10:42 AM Negative  Negative   09/16/2020  4:09 PM Negative  Negative   07/06/2020  2:44 PM Negative  Negative   03/10/2020  3:31 PM Negative  Negative   11/13/2019  4:19 PM Negative  Negative   07/25/2019  4:00 PM Negative  Negative   03/21/2019 12:00 AM Negative  Negative   12/11/2018 12:00 AM Negative  Negative   05/10/2018 12:00 AM Negative  Negative     Assessment: Denim presents today for follow up of HIV PrEP. He is currently taking Descovy . He reports that he is taking it daily, but has been out since Sunday. No STI symptoms but accepts testing today. No rectal exposure so declines rectal screening. Note he is due for HAV, HPV, COVID, and MPX vaccines. Declines these today. Given his lack of refills, I have provided him samples, but requested that  he makes his appointments in the future prior to running out of Descovy  as to not run into issues with keeping his supply.  Plan: - HIV Antibody,  when negative - will send Descovy  refills to [facility] - STI testing: RPR, oral/urine gonorrhea/chlamydia  - Follow up with Cassie on 05/15/24 - Follow up with any questions or concerns  Estela Held, PharmD PGY-2 Infectious Diseases Pharmacy Resident [facility] 02/14/2024 2:17 PM

## 2024-02-14 ENCOUNTER — Other Ambulatory Visit: Payer: Self-pay

## 2024-02-14 ENCOUNTER — Ambulatory Visit (INDEPENDENT_AMBULATORY_CARE_PROVIDER_SITE_OTHER): Admitting: Pharmacist

## 2024-02-14 ENCOUNTER — Other Ambulatory Visit (HOSPITAL_COMMUNITY)
Admission: RE | Admit: 2024-02-14 | Discharge: 2024-02-14 | Disposition: A | Discharge status: Home / Self Care | Source: Ambulatory Visit | Attending: Infectious Disease | Admitting: Infectious Disease

## 2024-02-14 DIAGNOSIS — Z113 Encounter for screening for infections with a predominantly sexual mode of transmission: Secondary | ICD-10-CM | POA: Diagnosis present

## 2024-02-14 DIAGNOSIS — Z2981 Encounter for HIV pre-exposure prophylaxis: Secondary | ICD-10-CM | POA: Diagnosis present

## 2024-02-14 MED ORDER — DESCOVY 200-25 MG PO TABS
1.0000 | ORAL_TABLET | Freq: Every day | ORAL | Status: AC
Start: 2024-02-14 — End: 2024-02-21

## 2024-02-14 NOTE — Progress Notes (Addendum)
 Medication Samples have been provided to the patient.  Drug name: Descovy         Qty: 1 bottle (7 tablets)   LOT: [id number]   Exp.Date: 12/2025  Samples requested by me.  Dosing instructions: Take one tablet by mouth once daily  The patient has been instructed regarding the correct time, dose, and frequency of taking this medication, including desired effects and most common side effects.   Keyleigh Manninen L. Liza, PharmD, BCIDP, AAHIVP, CPP Clinical Pharmacist Practitioner Infectious Diseases Clinical Pharmacist [facility] 09/21/2020, 10:07 AM

## 2024-02-15 ENCOUNTER — Other Ambulatory Visit (HOSPITAL_COMMUNITY): Payer: Self-pay

## 2024-02-15 LAB — CYTOLOGY, (ORAL, ANAL, URETHRAL) ANCILLARY ONLY
Chlamydia: NEGATIVE
Comment: NEGATIVE
Comment: NORMAL
Neisseria Gonorrhea: NEGATIVE

## 2024-02-15 LAB — URINE CYTOLOGY ANCILLARY ONLY
Chlamydia: NEGATIVE
Comment: NEGATIVE
Comment: NORMAL
Neisseria Gonorrhea: NEGATIVE

## 2024-02-15 LAB — HIV ANTIBODY (ROUTINE TESTING W REFLEX): HIV 1&2 Ab, 4th Generation: NONREACTIVE

## 2024-02-15 LAB — RPR: RPR Ser Ql: NONREACTIVE

## 2024-02-16 ENCOUNTER — Other Ambulatory Visit: Payer: Self-pay

## 2024-02-16 DIAGNOSIS — Z2981 Encounter for HIV pre-exposure prophylaxis: Secondary | ICD-10-CM

## 2024-02-16 DIAGNOSIS — Z79899 Other long term (current) drug therapy: Secondary | ICD-10-CM

## 2024-02-16 MED ORDER — DESCOVY 200-25 MG PO TABS: 1.0000 | ORAL_TABLET | Freq: Every day | ORAL | 2 refills | Status: DC

## 2024-05-14 NOTE — Progress Notes (Unsigned)
 HPI: Jeremy Bell is a 43 y.o. male who presents to the [facility] pharmacy clinic for HIV PrEP follow-up.  Insured   [x]    Uninsured  []    Patient Active Problem List   Diagnosis Date Noted   Hypokalemia 08/09/2021   Anogenital wart 08/08/2021   Essential hypertension 08/08/2021   Perirectal abscess 08/08/2021   Genital herpes 04/12/2021    Patient's Medications  New Prescriptions   No medications on file  Previous Medications   BIOTIN 89999 MCG TABS    Take 10,000 mcg by mouth daily.   CYCLOBENZAPRINE  (FLEXERIL ) 10 MG TABLET    Take 1 tablet (10 mg total) by mouth 3 (three) times daily as needed for muscle spasms.   DOCUSATE SODIUM  (COLACE) 100 MG CAPSULE    Take 1 capsule (100 mg total) by mouth daily as needed for mild constipation.   EMTRICITABINE -TENOFOVIR  AF (DESCOVY ) 200-25 MG TABLET    Take 1 tablet by mouth daily.   FAMOTIDINE  (PEPCID ) 20 MG TABLET    Take 1 tablet (20 mg total) by mouth 2 (two) times daily.   IBUPROFEN  (MOTRIN  IB) 200 MG TABLET    Take 2 tablets (400 mg total) by mouth every 6 (six) hours as needed for moderate pain.   METHOCARBAMOL  (ROBAXIN ) 500 MG TABLET    Take 1 tablet (500 mg total) by mouth every 8 (eight) hours as needed for muscle spasms.   MULTIPLE VITAMIN (MULTIVITAMIN) TABLET    Take 1 tablet by mouth daily.   OMEPRAZOLE  (PRILOSEC) 20 MG CAPSULE    Take 1 capsule (20 mg total) by mouth 2 (two) times daily before a meal for 14 days.   ONDANSETRON  (ZOFRAN ) 4 MG TABLET    Take 1 tablet (4 mg total) by mouth every 6 (six) hours.   ONDANSETRON  (ZOFRAN -ODT) 8 MG DISINTEGRATING TABLET    Take 1 tablet (8 mg total) by mouth every 8 (eight) hours as needed for nausea or vomiting. 8mg  ODT q4 hours prn nausea   OXYCODONE -ACETAMINOPHEN  (PERCOCET) 10-325 MG TABLET    Take 1 tablet by mouth every 4 (four) hours as needed for pain.   POLYETHYLENE GLYCOL (MIRALAX  / GLYCOLAX ) 17 G PACKET    Take 17 g by mouth daily as needed for mild constipation.  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    Gender at birth Male Male Male Male Male Male  Male  Gender identity cis-Male  cis-Male  cis-Male  cis-Male  cis-Male   cis-Male   Risk for HIV  Condomless vaginal or anal intercourse;In sexual relationship with HIV+ partner;>5 partners in past 6 mos (regardless of condom use)  >5 partners in past 6 mos (regardless of condom use);Hx of STI;Condomless vaginal or anal intercourse  >5 partners in past 6 mos (regardless of condom use);Condomless vaginal or anal intercourse;In sexual relationship with HIV+ partner   In sexual relationship with HIV+ partner   In sexual relationship with HIV+ partner   Sex Partners Men only Men only Men  only Men only  Men only  Men only  # sex partners past 3-6 mos 1-3  >12  >12  10-12   1-3   1-3   Sex activity preferences Insertive Insertive Insertive Insertive  Insertive  Insertive  Condom use Yes No No Yes  No  Yes  % condom use    1  25  59  Partners genders and ages      M 30-49  M 25-29  Treated for STI? No No No No     HIV symptoms?  N/A  N/A  N/A    N/A   PrEP Eligibility Substantial risk for HIV  Substantial risk for HIV  Substantial risk for HIV  Substantial risk for HIV    HIV negative;Substantial risk for HIV   Paper work received? Yes           Proxy-reported   Data saved with a previous flowsheet row definition    Labs:  SCr: Lab Results  Component Value Date   CREATININE 0.97 10/24/2023   CREATININE 1.04 07/18/2022   CREATININE 1.03 08/09/2021   CREATININE 0.99 08/08/2021   CREATININE 1.10 12/17/2020   HIV Lab Results  Component Value Date    HIV NON-REACTIVE 02/14/2024   HIV NON-REACTIVE 10/24/2023   HIV NON-REACTIVE 07/26/2023   HIV NON-REACTIVE 04/19/2023   HIV NON-REACTIVE 12/29/2022   Hepatitis B Lab Results  Component Value Date   HEPBSAB REACTIVE (A) 05/10/2018   HEPBSAG NON-REACTIVE 05/10/2018   Hepatitis C No results found for: HEPCAB, HCVRNAPCRQN Hepatitis A Lab Results  Component Value Date   HAV NON-REACTIVE 04/06/2022   RPR and STI Lab Results  Component Value Date   LABRPR NON-REACTIVE 02/14/2024   LABRPR NON-REACTIVE 10/24/2023   LABRPR NON-REACTIVE 07/26/2023   LABRPR NON-REACTIVE 04/19/2023   LABRPR NON-REACTIVE 12/29/2022    STI Results GC CT  02/14/2024  2:12 PM Negative    Negative  Negative    Negative   07/26/2023  3:41 PM Negative    Negative  Negative    Negative   04/19/2023  3:47 PM Negative    Negative  Negative    Negative   12/29/2022  3:47 PM Negative    Negative  Negative    Negative   09/20/2022  3:30 PM Negative    Negative  Negative    Negative   07/05/2022  3:52 PM Negative    Negative  Negative    Negative   04/06/2022 10:01 AM Negative  Negative   12/29/2021 11:20 AM Negative  Negative   09/22/2021  3:29 PM Negative    Negative  Negative    Negative   06/21/2021  2:02 PM Negative  Negative   03/23/2021  5:15 PM Negative  Negative   12/17/2020 10:42 AM Negative  Negative   09/16/2020  4:09 PM Negative  Negative   07/06/2020  2:44 PM Negative  Negative   03/10/2020  3:31 PM Negative  Negative   11/13/2019  4:19 PM Negative  Negative   07/25/2019  4:00 PM Negative  Negative   03/21/2019 12:00 AM Negative  Negative   12/11/2018 12:00 AM Negative  Negative   05/10/2018 12:00 AM Negative  Negative     Assessment: Jeremy Bell is here today to follow up for PrEP. He denies any issues taking Descovy  and last filled on 05/02/24. He developed a chancre on his penis around the end of May and was seen at urgent care. He was treated for herpes at this  visit. His  chancre got a little better after that treatment but did not fully go away. He then developed a rash on his hands and feet and was seen in the ED yesterday. 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 He was also given doxycycline  at that visit as well.   He comes in today wanting to discuss all his recent issues and to get my opinion. I firmly believe that all of his symptoms the last three months have been related to syphilis. 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 I put a picture of the rash on his hands in the media tab. He had a HIV ab test yesterday as well that was negative so will go ahead and refill his Descovy . No issues taking or tolerating that.   I cannot see where they tested him for gonorrhea or chlamydia so will do that today. He is interested in any preventative measures so discussed doxyPEP with him. Discussed administration, dosing, and side effects. Will send in a supply for him. Also educated that he is eligible for Hepatitis A and HPV vaccinations, and he agrees to start both of these today.  He is requesting to come back in ~4 weeks for repeat testing. I educated him on syphilis testing and how his titer may not move if checked that soon and that we typically don't recheck (unless he has another exposure or symptoms) for 6-12 months as that is how long it can take his RPR titer to come down. Educated him that a 4-fold decrease is 1:8 and what we would be looking for around that 6-12 month mark. He understands but still wishes to get repeat testing so will schedule that for him today. I told him that he would not require further treatment if his RPR titer is still 1:32. He acknowledged understanding. Will see him back in 4 weeks.  Plan: - Refill Descovy  - sent to [facility] in [place] - Start doxyPEP - sent to [facility] at  [facility] - Oral/urine cytology for gonorrhea and chlamydia today - HPV vaccine 1 of 3 (next due in 4 weeks at next appointment; then in February 2026) - Hep A vaccine 1 of 2 (next one due in February 2026) - Follow up on 06/15/24 for STI testing and 2nd HPV vaccine  Craig Ionescu L. Adian Jablonowski, PharmD, BCIDP, AAHIVP, CPP Clinical Pharmacist Practitioner - Infectious Diseases Clinical Pharmacist Lead - Specialty Pharmacy [facility]

## 2024-05-15 ENCOUNTER — Other Ambulatory Visit (HOSPITAL_COMMUNITY)
Admission: RE | Admit: 2024-05-15 | Discharge: 2024-05-15 | Disposition: A | Discharge status: Home / Self Care | Source: Ambulatory Visit | Attending: Infectious Disease | Admitting: Infectious Disease

## 2024-05-15 ENCOUNTER — Other Ambulatory Visit: Payer: Self-pay

## 2024-05-15 ENCOUNTER — Ambulatory Visit: Admitting: Pharmacist

## 2024-05-15 ENCOUNTER — Other Ambulatory Visit (HOSPITAL_COMMUNITY): Payer: Self-pay

## 2024-05-15 DIAGNOSIS — Z23 Encounter for immunization: Secondary | ICD-10-CM | POA: Diagnosis present

## 2024-05-15 DIAGNOSIS — Z113 Encounter for screening for infections with a predominantly sexual mode of transmission: Secondary | ICD-10-CM

## 2024-05-15 DIAGNOSIS — Z2981 Encounter for HIV pre-exposure prophylaxis: Secondary | ICD-10-CM

## 2024-05-15 DIAGNOSIS — Z79899 Other long term (current) drug therapy: Secondary | ICD-10-CM

## 2024-05-15 MED ORDER — DESCOVY 200-25 MG PO TABS
1.0000 | ORAL_TABLET | Freq: Every day | ORAL | 2 refills | Status: DC
Start: 2024-05-15 — End: 2024-08-22

## 2024-05-15 MED ORDER — DOXYCYCLINE HYCLATE 100 MG PO TABS
ORAL_TABLET | ORAL | 1 refills | Status: DC
  Filled 2024-05-15 (×2): qty 30, 15d supply, fill #0
  Filled 2024-05-15: qty 30, 28d supply, fill #0
  Filled 2024-07-04: qty 30, 28d supply, fill #1

## 2024-05-15 NOTE — Patient Instructions (Signed)
 HPV vaccine Hepatitis A vaccine

## 2024-05-17 LAB — URINE CYTOLOGY ANCILLARY ONLY
Chlamydia: NEGATIVE
Comment: NEGATIVE
Comment: NORMAL
Neisseria Gonorrhea: NEGATIVE

## 2024-05-17 LAB — CYTOLOGY, (ORAL, ANAL, URETHRAL) ANCILLARY ONLY
Chlamydia: NEGATIVE
Comment: NEGATIVE
Comment: NORMAL
Neisseria Gonorrhea: NEGATIVE

## 2024-06-11 NOTE — Progress Notes (Unsigned)
 HPI: Jeremy Bell is a 43 y.o. male who presents to the [facility] pharmacy clinic for HIV PrEP follow-up.  Insured   [x]    Uninsured  []    Patient Active Problem List   Diagnosis Date Noted   Hypokalemia 08/09/2021   Anogenital wart 08/08/2021   Essential hypertension 08/08/2021   Perirectal abscess 08/08/2021   Genital herpes 04/12/2021    Patient's Medications  New Prescriptions   No medications on file  Previous Medications   BIOTIN 89999 MCG TABS    Take 10,000 mcg by mouth daily.   CYCLOBENZAPRINE  (FLEXERIL ) 10 MG TABLET    Take 1 tablet (10 mg total) by mouth 3 (three) times daily as needed for muscle spasms.   DOCUSATE SODIUM  (COLACE) 100 MG CAPSULE    Take 1 capsule (100 mg total) by mouth daily as needed for mild constipation.   DOXYCYCLINE  (VIBRA -TABS) 100 MG TABLET    Take 2 tablets (200 mg) by mouth 24-72 hours after sex   EMTRICITABINE -TENOFOVIR  AF (DESCOVY ) 200-25 MG TABLET    Take 1 tablet by mouth daily.   FAMOTIDINE  (PEPCID ) 20 MG TABLET    Take 1 tablet (20 mg total) by mouth 2 (two) times daily.   IBUPROFEN  (MOTRIN  IB) 200 MG TABLET    Take 2 tablets (400 mg total) by mouth every 6 (six) hours as needed for moderate pain.   METHOCARBAMOL  (ROBAXIN ) 500 MG TABLET    Take 1 tablet (500 mg total) by mouth every 8 (eight) hours as needed for muscle spasms.   MULTIPLE VITAMIN (MULTIVITAMIN) TABLET    Take 1 tablet by mouth daily.   OMEPRAZOLE  (PRILOSEC) 20 MG CAPSULE    Take 1 capsule (20 mg total) by mouth 2 (two) times daily before a meal for 14 days.   ONDANSETRON  (ZOFRAN ) 4 MG TABLET    Take 1 tablet (4 mg total) by mouth every 6 (six) hours.   ONDANSETRON  (ZOFRAN -ODT) 8 MG DISINTEGRATING TABLET    Take 1 tablet (8 mg total) by mouth every 8 (eight) hours as needed for nausea or vomiting. 8mg  ODT q4 hours prn nausea   OXYCODONE -ACETAMINOPHEN  (PERCOCET) 10-325 MG TABLET    Take 1 tablet by mouth every 4 (four) hours as needed for pain.   POLYETHYLENE GLYCOL (MIRALAX  /  GLYCOLAX ) 17 G PACKET    Take 17 g by mouth daily as needed for mild constipation.  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    Gender at birth Male Male Male Male Male Male  Male  Gender identity cis-Male  cis-Male  cis-Male  cis-Male  cis-Male   cis-Male   Risk for HIV  Condomless vaginal or anal intercourse;In sexual relationship with HIV+ partner;>5 partners in past 6 mos (regardless of condom use)  >5 partners in past 6 mos (regardless of condom use);Hx of STI;Condomless vaginal or anal intercourse  >5 partners in past 6 mos (regardless of condom use);Condomless vaginal or anal intercourse;In sexual relationship with HIV+ partner   In sexual  relationship with HIV+ partner   In sexual relationship with HIV+ partner   Sex Partners Men only Men only Men only Men only  Men only  Men only  # sex partners past 3-6 mos 1-3  >12  >12  10-12   1-3   1-3   Sex activity preferences Insertive Insertive Insertive Insertive  Insertive  Insertive  Condom use Yes No No Yes  No  Yes  % condom use    43  25  59  Partners genders and ages      M 30-49  M 25-29  Treated for STI? No No No No     HIV symptoms?  N/A  N/A  N/A    N/A   PrEP Eligibility Substantial risk for HIV  Substantial risk for HIV  Substantial risk for HIV  Substantial risk for HIV    HIV negative;Substantial risk for HIV   Paper work received? Yes           Proxy-reported   Data saved with a previous flowsheet row definition    Labs:  SCr: Lab Results  Component Value Date   CREATININE 0.97 10/24/2023   CREATININE 1.04 07/18/2022   CREATININE 1.03 08/09/2021    CREATININE 0.99 08/08/2021   CREATININE 1.10 12/17/2020   HIV Lab Results  Component Value Date   HIV NON-REACTIVE 02/14/2024   HIV NON-REACTIVE 10/24/2023   HIV NON-REACTIVE 07/26/2023   HIV NON-REACTIVE 04/19/2023   HIV NON-REACTIVE 12/29/2022   Hepatitis B Lab Results  Component Value Date   HEPBSAB REACTIVE (A) 05/10/2018   HEPBSAG NON-REACTIVE 05/10/2018   Hepatitis C No results found for: HEPCAB, HCVRNAPCRQN Hepatitis A Lab Results  Component Value Date   HAV NON-REACTIVE 04/06/2022   RPR and STI Lab Results  Component Value Date   LABRPR NON-REACTIVE 02/14/2024   LABRPR NON-REACTIVE 10/24/2023   LABRPR NON-REACTIVE 07/26/2023   LABRPR NON-REACTIVE 04/19/2023   LABRPR NON-REACTIVE 12/29/2022    STI Results GC CT  05/15/2024  4:29 PM Negative    Negative  Negative    Negative   02/14/2024  2:12 PM Negative    Negative  Negative    Negative   07/26/2023  3:41 PM Negative    Negative  Negative    Negative   04/19/2023  3:47 PM Negative    Negative  Negative    Negative   12/29/2022  3:47 PM Negative    Negative  Negative    Negative   09/20/2022  3:30 PM Negative    Negative  Negative    Negative   07/05/2022  3:52 PM Negative    Negative  Negative    Negative   04/06/2022 10:01 AM Negative  Negative   12/29/2021 11:20 AM Negative  Negative   09/22/2021  3:29 PM Negative    Negative  Negative    Negative   06/21/2021  2:02 PM Negative  Negative   03/23/2021  5:15 PM Negative  Negative   12/17/2020 10:42 AM Negative  Negative   09/16/2020  4:09 PM Negative  Negative   07/06/2020  2:44 PM Negative  Negative   03/10/2020  3:31 PM Negative  Negative   11/13/2019  4:19 PM Negative  Negative   07/25/2019  4:00 PM Negative  Negative   03/21/2019 12:00 AM Negative  Negative   12/11/2018 12:00 AM Negative  Negative     Assessment: Jeremy Bell is here today to follow up for PrEP and to get STI testing. I  last saw him on 8/6 where he was positive for  syphilis. He received treatment for this in the ED on 05/14/24. His rash and chancre have complete resolved. He is due for his 2nd HPV vaccine so will administer that today. He has not had any new partners but wishes to get testing again today. Reminded him that his RPR titer may be the same as before and that it does not mean he has active syphilis. Advised that it may take 6 months or longer to see a decline. He agrees and verbalizes understanding. Will see him back in 2 months for PrEP follow up.  Plan: - HIV ab, RPR, and oral/urine cytologies today - 2nd HPV vaccine today (last one due in February 2026) - Follow up in 2 months on 08/14/24  Leighanne Adolph L. Viktor Philipp, PharmD, BCIDP, AAHIVP, CPP Clinical Pharmacist Practitioner - Infectious Diseases Clinical Pharmacist Lead - Specialty Pharmacy [facility]

## 2024-06-12 ENCOUNTER — Other Ambulatory Visit (HOSPITAL_COMMUNITY)
Admission: RE | Admit: 2024-06-12 | Discharge: 2024-06-12 | Disposition: A | Discharge status: Home / Self Care | Source: Ambulatory Visit | Attending: Infectious Disease | Admitting: Infectious Disease

## 2024-06-12 ENCOUNTER — Other Ambulatory Visit: Payer: Self-pay

## 2024-06-12 ENCOUNTER — Ambulatory Visit: Admitting: Pharmacist

## 2024-06-12 DIAGNOSIS — Z23 Encounter for immunization: Secondary | ICD-10-CM | POA: Diagnosis present

## 2024-06-12 DIAGNOSIS — Z113 Encounter for screening for infections with a predominantly sexual mode of transmission: Secondary | ICD-10-CM | POA: Insufficient documentation

## 2024-06-13 LAB — URINE CYTOLOGY ANCILLARY ONLY
Chlamydia: NEGATIVE
Comment: NEGATIVE
Comment: NORMAL
Neisseria Gonorrhea: NEGATIVE

## 2024-06-13 LAB — CYTOLOGY, (ORAL, ANAL, URETHRAL) ANCILLARY ONLY
Chlamydia: NEGATIVE
Comment: NEGATIVE
Comment: NORMAL
Neisseria Gonorrhea: NEGATIVE

## 2024-06-14 ENCOUNTER — Encounter: Payer: Self-pay | Admitting: Pharmacist

## 2024-06-15 LAB — HIV ANTIBODY (ROUTINE TESTING W REFLEX): HIV 1&2 Ab, 4th Generation: NONREACTIVE

## 2024-06-15 LAB — RPR TITER: RPR Titer: 1:16 {titer} — ABNORMAL HIGH

## 2024-06-15 LAB — RPR: RPR Ser Ql: REACTIVE — AB

## 2024-06-15 LAB — T PALLIDUM AB: T Pallidum Abs: POSITIVE — AB

## 2024-07-04 ENCOUNTER — Other Ambulatory Visit (HOSPITAL_COMMUNITY): Payer: Self-pay

## 2024-07-04 ENCOUNTER — Other Ambulatory Visit: Payer: Self-pay

## 2024-07-04 ENCOUNTER — Encounter: Payer: Self-pay | Admitting: Pharmacist

## 2024-08-14 ENCOUNTER — Ambulatory Visit: Admitting: Pharmacist

## 2024-08-14 NOTE — Progress Notes (Unsigned)
 HPI: Jeremy Bell is a 43 y.o. male who presents to the [facility] pharmacy clinic for HIV PrEP follow-up.  Referring ID Physician: Dr. Fleeta Rothman  Patient Active Problem List   Diagnosis Date Noted   Hypokalemia 08/09/2021   Anogenital wart 08/08/2021   Essential hypertension 08/08/2021   Perirectal abscess 08/08/2021   Genital herpes 04/12/2021    Patient's Medications  New Prescriptions   No medications on file  Previous Medications   BIOTIN 89999 MCG TABS    Take 10,000 mcg by mouth daily.   CYCLOBENZAPRINE  (FLEXERIL ) 10 MG TABLET    Take 1 tablet (10 mg total) by mouth 3 (three) times daily as needed for muscle spasms.   DOCUSATE SODIUM  (COLACE) 100 MG CAPSULE    Take 1 capsule (100 mg total) by mouth daily as needed for mild constipation.   DOXYCYCLINE  (VIBRA -TABS) 100 MG TABLET    Take 2 tablets (200 mg) by mouth 24-72 hours after sex   EMTRICITABINE -TENOFOVIR  AF (DESCOVY ) 200-25 MG TABLET    Take 1 tablet by mouth daily.   FAMOTIDINE  (PEPCID ) 20 MG TABLET    Take 1 tablet (20 mg total) by mouth 2 (two) times daily.   IBUPROFEN  (MOTRIN  IB) 200 MG TABLET    Take 2 tablets (400 mg total) by mouth every 6 (six) hours as needed for moderate pain.   METHOCARBAMOL  (ROBAXIN ) 500 MG TABLET    Take 1 tablet (500 mg total) by mouth every 8 (eight) hours as needed for muscle spasms.   MULTIPLE VITAMIN (MULTIVITAMIN) TABLET    Take 1 tablet by mouth daily.   OMEPRAZOLE  (PRILOSEC) 20 MG CAPSULE    Take 1 capsule (20 mg total) by mouth 2 (two) times daily before a meal for 14 days.   ONDANSETRON  (ZOFRAN ) 4 MG TABLET    Take 1 tablet (4 mg total) by mouth every 6 (six) hours.   ONDANSETRON  (ZOFRAN -ODT) 8 MG DISINTEGRATING TABLET    Take 1 tablet (8 mg total) by mouth every 8 (eight) hours as needed for nausea or vomiting. 8mg  ODT q4 hours prn nausea   OXYCODONE -ACETAMINOPHEN  (PERCOCET) 10-325 MG TABLET    Take 1 tablet by mouth every 4 (four) hours as needed for pain.   POLYETHYLENE GLYCOL  (MIRALAX  / GLYCOLAX ) 17 G PACKET    Take 17 g by mouth daily as needed for mild constipation.  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    Gender at birth Male Male Male Male Male Male  Male  Gender identity cis-Male  cis-Male  cis-Male  cis-Male  cis-Male   cis-Male   Risk for HIV  Condomless vaginal or anal intercourse;In sexual relationship with HIV+ partner;>5 partners in past 6 mos (regardless of condom use)  >5 partners in past 6 mos (regardless of condom use);Hx of STI;Condomless vaginal or anal intercourse  >5 partners in past 6 mos (regardless of condom use);Condomless vaginal or anal intercourse;In sexual relationship with HIV+ partner   In sexual relationship with HIV+ partner  In sexual relationship with HIV+ partner   Sex Partners Men only Men only Men only Men only  Men only  Men only  # sex partners past 3-6 mos 1-3  >12  >12  10-12   1-3   1-3   Sex activity preferences Insertive Insertive Insertive Insertive  Insertive  Insertive  Condom use Yes No No Yes  No  Yes  % condom use    9  25  39  Partners genders and ages      M 30-49  M 25-29  Treated for STI? No No No No     HIV symptoms?  N/A  N/A  N/A    N/A   PrEP Eligibility Substantial risk for HIV  Substantial risk for HIV  Substantial risk for HIV  Substantial risk for HIV    HIV negative;Substantial risk for HIV   Paper work received? Yes           Proxy-reported   Data saved with a previous flowsheet row definition    Labs:  SCr: Lab Results  Component Value Date   CREATININE 0.97 10/24/2023   CREATININE 1.04 07/18/2022   CREATININE 1.03 08/09/2021    CREATININE 0.99 08/08/2021   CREATININE 1.10 12/17/2020   HIV Lab Results  Component Value Date   HIV NON-REACTIVE 06/12/2024   HIV NON-REACTIVE 02/14/2024   HIV NON-REACTIVE 10/24/2023   HIV NON-REACTIVE 07/26/2023   HIV NON-REACTIVE 04/19/2023   Hepatitis B Lab Results  Component Value Date   HEPBSAB REACTIVE (A) 05/10/2018   HEPBSAG NON-REACTIVE 05/10/2018   Hepatitis C No results found for: HEPCAB, HCVRNAPCRQN Hepatitis A Lab Results  Component Value Date   HAV NON-REACTIVE 04/06/2022   RPR and STI Lab Results  Component Value Date   LABRPR REACTIVE (A) 06/12/2024   LABRPR NON-REACTIVE 02/14/2024   LABRPR NON-REACTIVE 10/24/2023   LABRPR NON-REACTIVE 07/26/2023   LABRPR NON-REACTIVE 04/19/2023   RPRTITER 1:16 (H) 06/12/2024    STI Results GC CT  06/12/2024  3:34 PM Negative    Negative  Negative    Negative   05/15/2024  4:29 PM Negative    Negative  Negative    Negative   02/14/2024  2:12 PM Negative    Negative  Negative    Negative   07/26/2023  3:41 PM Negative    Negative  Negative    Negative   04/19/2023  3:47 PM Negative    Negative  Negative    Negative   12/29/2022  3:47 PM Negative    Negative  Negative    Negative   09/20/2022  3:30 PM Negative    Negative  Negative    Negative   07/05/2022  3:52 PM Negative    Negative  Negative    Negative   04/06/2022 10:01 AM Negative  Negative   12/29/2021 11:20 AM Negative  Negative   09/22/2021  3:29 PM Negative    Negative  Negative    Negative   06/21/2021  2:02 PM Negative  Negative   03/23/2021  5:15 PM Negative  Negative   12/17/2020 10:42 AM Negative  Negative   09/16/2020  4:09 PM Negative  Negative   07/06/2020  2:44 PM Negative  Negative   03/10/2020  3:31 PM Negative  Negative   11/13/2019  4:19 PM Negative  Negative   07/25/2019  4:00 PM Negative  Negative   03/21/2019 12:00 AM Negative  Negative     Assessment: Jeremy Bell is here today for  HIV PrEP follow up. Reports  doing well on Descovy  without any issues. Patient denies *** experiencing acute HIV symptoms (e.g., fever, night sweats, fatigue, muscle aches, rash, sore throat, lymphadenopathy, headache, nausea/vomiting/diarrhea).    How are you today?  Tolerating Descovy ?  Any missed doses of Descovy ? (No fill hx for 07/2024?) Any new STI exposures/partners? STI testing today?  Okay on DoxyPEP rx?  Likely do not need to recheck RPR today unless concern for new infection (titer due 11/2024)  Labs: Last HIV ab was negative on 06/12/2024; urine/oral/rectal cytologies for GC/Chlamydia, RPR ***  Eligible vaccinations: HPV 3/3 (due in February 2026), Havrix 2/2 (due in February 2026), influenza, covid  ***  Plan: ***  Feliciano Close, PharmD PGY2 Infectious Diseases Pharmacy Resident

## 2024-08-20 NOTE — Progress Notes (Unsigned)
 HPI: Jeremy Bell is a 43 y.o. male who presents to the [facility] pharmacy clinic for HIV PrEP follow-up.  Referring ID Physician: Dr. Fleeta Rothman  Patient Active Problem List   Diagnosis Date Noted   Hypokalemia 08/09/2021   Anogenital wart 08/08/2021   Essential hypertension 08/08/2021   Perirectal abscess 08/08/2021   Genital herpes 04/12/2021    Patient's Medications  New Prescriptions   No medications on file  Previous Medications   BIOTIN 89999 MCG TABS    Take 10,000 mcg by mouth daily.   CYCLOBENZAPRINE  (FLEXERIL ) 10 MG TABLET    Take 1 tablet (10 mg total) by mouth 3 (three) times daily as needed for muscle spasms.   DOCUSATE SODIUM  (COLACE) 100 MG CAPSULE    Take 1 capsule (100 mg total) by mouth daily as needed for mild constipation.   DOXYCYCLINE  (VIBRA -TABS) 100 MG TABLET    Take 2 tablets (200 mg) by mouth 24-72 hours after sex   EMTRICITABINE -TENOFOVIR  AF (DESCOVY ) 200-25 MG TABLET    Take 1 tablet by mouth daily.   FAMOTIDINE  (PEPCID ) 20 MG TABLET    Take 1 tablet (20 mg total) by mouth 2 (two) times daily.   IBUPROFEN  (MOTRIN  IB) 200 MG TABLET    Take 2 tablets (400 mg total) by mouth every 6 (six) hours as needed for moderate pain.   METHOCARBAMOL  (ROBAXIN ) 500 MG TABLET    Take 1 tablet (500 mg total) by mouth every 8 (eight) hours as needed for muscle spasms.   MULTIPLE VITAMIN (MULTIVITAMIN) TABLET    Take 1 tablet by mouth daily.   OMEPRAZOLE  (PRILOSEC) 20 MG CAPSULE    Take 1 capsule (20 mg total) by mouth 2 (two) times daily before a meal for 14 days.   ONDANSETRON  (ZOFRAN ) 4 MG TABLET    Take 1 tablet (4 mg total) by mouth every 6 (six) hours.   ONDANSETRON  (ZOFRAN -ODT) 8 MG DISINTEGRATING TABLET    Take 1 tablet (8 mg total) by mouth every 8 (eight) hours as needed for nausea or vomiting. 8mg  ODT q4 hours prn nausea   OXYCODONE -ACETAMINOPHEN  (PERCOCET) 10-325 MG TABLET    Take 1 tablet by mouth every 4 (four) hours as needed for pain.   POLYETHYLENE GLYCOL  (MIRALAX  / GLYCOLAX ) 17 G PACKET    Take 17 g by mouth daily as needed for mild constipation.  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    Gender at birth Male Male Male Male Male Male  Male  Gender identity cis-Male  cis-Male  cis-Male  cis-Male  cis-Male   cis-Male   Risk for HIV  Condomless vaginal or anal intercourse;In sexual relationship with HIV+ partner;>5 partners in past 6 mos (regardless of condom use)  >5 partners in past 6 mos (regardless of condom use);Hx of STI;Condomless vaginal or anal intercourse  >5 partners in past 6 mos (regardless of condom use);Condomless vaginal or anal intercourse;In sexual relationship with HIV+ partner   In sexual relationship with HIV+ partner  In sexual relationship with HIV+ partner   Sex Partners Men only Men only Men only Men only  Men only  Men only  # sex partners past 3-6 mos 1-3  >12  >12  10-12   1-3   1-3   Sex activity preferences Insertive Insertive Insertive Insertive  Insertive  Insertive  Condom use Yes No No Yes  No  Yes  % condom use    21  25  46  Partners genders and ages      M 30-49  M 25-29  Treated for STI? No No No No     HIV symptoms?  N/A  N/A  N/A    N/A   PrEP Eligibility Substantial risk for HIV  Substantial risk for HIV  Substantial risk for HIV  Substantial risk for HIV    HIV negative;Substantial risk for HIV   Paper work received? Yes           Proxy-reported   Data saved with a previous flowsheet row definition    Labs:  SCr: Lab Results  Component Value Date   CREATININE 0.97 10/24/2023   CREATININE 1.04 07/18/2022   CREATININE 1.03 08/09/2021    CREATININE 0.99 08/08/2021   CREATININE 1.10 12/17/2020   HIV Lab Results  Component Value Date   HIV NON-REACTIVE 06/12/2024   HIV NON-REACTIVE 02/14/2024   HIV NON-REACTIVE 10/24/2023   HIV NON-REACTIVE 07/26/2023   HIV NON-REACTIVE 04/19/2023   Hepatitis B Lab Results  Component Value Date   HEPBSAB REACTIVE (A) 05/10/2018   HEPBSAG NON-REACTIVE 05/10/2018   Hepatitis C No results found for: HEPCAB, HCVRNAPCRQN Hepatitis A Lab Results  Component Value Date   HAV NON-REACTIVE 04/06/2022   RPR and STI Lab Results  Component Value Date   LABRPR REACTIVE (A) 06/12/2024   LABRPR NON-REACTIVE 02/14/2024   LABRPR NON-REACTIVE 10/24/2023   LABRPR NON-REACTIVE 07/26/2023   LABRPR NON-REACTIVE 04/19/2023   RPRTITER 1:16 (H) 06/12/2024    STI Results GC CT  06/12/2024  3:34 PM Negative    Negative  Negative    Negative   05/15/2024  4:29 PM Negative    Negative  Negative    Negative   02/14/2024  2:12 PM Negative    Negative  Negative    Negative   07/26/2023  3:41 PM Negative    Negative  Negative    Negative   04/19/2023  3:47 PM Negative    Negative  Negative    Negative   12/29/2022  3:47 PM Negative    Negative  Negative    Negative   09/20/2022  3:30 PM Negative    Negative  Negative    Negative   07/05/2022  3:52 PM Negative    Negative  Negative    Negative   04/06/2022 10:01 AM Negative  Negative   12/29/2021 11:20 AM Negative  Negative   09/22/2021  3:29 PM Negative    Negative  Negative    Negative   06/21/2021  2:02 PM Negative  Negative   03/23/2021  5:15 PM Negative  Negative   12/17/2020 10:42 AM Negative  Negative   09/16/2020  4:09 PM Negative  Negative   07/06/2020  2:44 PM Negative  Negative   03/10/2020  3:31 PM Negative  Negative   11/13/2019  4:19 PM Negative  Negative   07/25/2019  4:00 PM Negative  Negative   03/21/2019 12:00 AM Negative  Negative     Assessment: Jeremy Bell is here today for  HIV PrEP follow up. Reports  doing well on Descovy  without any issues. Patient denies experiencing acute HIV symptoms (e.g., fever, night sweats, fatigue, muscle aches, rash, sore throat, lymphadenopathy, headache, nausea/vomiting/diarrhea). Patient reports missing two Descovy  doses due to running out of medication but overall reports good adherence and tolerating Descovy  well. Also requesting refills on DoxyPEP prescription which will be sent to preferred pharmacy.   Labs: Last HIV ab was negative on 06/12/2024; urine and oral cytologies for GC/Chlamydia  Eligible vaccinations: Influenza, Covid, HPV 3/3 (due ~10/15/2024), Havrix 2/2 (due ~11/15/2024) - Patient politely declines influenza and covid vaccines today  Plan: - HIV antibody today, urine and oral cytologies for GC/Chlamydia - Tentatively plan to send in prescription refills pending negative HIV antibody results - Send refills for DoxyPEP prescription to [facility] on [facility] per patient request  - Follow up on 11/20/2024 with [facility]

## 2024-08-21 ENCOUNTER — Ambulatory Visit (INDEPENDENT_AMBULATORY_CARE_PROVIDER_SITE_OTHER): Admitting: Pharmacist

## 2024-08-21 ENCOUNTER — Other Ambulatory Visit (HOSPITAL_COMMUNITY)
Admission: RE | Admit: 2024-08-21 | Discharge: 2024-08-21 | Disposition: A | Discharge status: Home / Self Care | Source: Ambulatory Visit | Attending: Infectious Disease | Admitting: Infectious Disease

## 2024-08-21 ENCOUNTER — Other Ambulatory Visit: Payer: Self-pay

## 2024-08-21 DIAGNOSIS — Z79899 Other long term (current) drug therapy: Secondary | ICD-10-CM | POA: Diagnosis not present

## 2024-08-21 DIAGNOSIS — Z113 Encounter for screening for infections with a predominantly sexual mode of transmission: Secondary | ICD-10-CM | POA: Diagnosis present

## 2024-08-21 DIAGNOSIS — Z202 Contact with and (suspected) exposure to infections with a predominantly sexual mode of transmission: Secondary | ICD-10-CM

## 2024-08-21 MED ORDER — DOXYCYCLINE HYCLATE 100 MG PO TABS: ORAL_TABLET | ORAL | 3 refills | Status: AC

## 2024-08-22 ENCOUNTER — Other Ambulatory Visit: Payer: Self-pay

## 2024-08-22 DIAGNOSIS — Z79899 Other long term (current) drug therapy: Secondary | ICD-10-CM

## 2024-08-22 LAB — HIV ANTIBODY (ROUTINE TESTING W REFLEX)
HIV 1&2 Ab, 4th Generation: NONREACTIVE
HIV FINAL INTERPRETATION: NEGATIVE

## 2024-08-22 MED ORDER — DESCOVY 200-25 MG PO TABS: 1.0000 | ORAL_TABLET | Freq: Every day | ORAL | 2 refills | Status: AC

## 2024-08-23 LAB — URINE CYTOLOGY ANCILLARY ONLY
Chlamydia: NEGATIVE
Comment: NEGATIVE
Comment: NORMAL
Neisseria Gonorrhea: NEGATIVE

## 2024-08-23 LAB — CYTOLOGY, (ORAL, ANAL, URETHRAL) ANCILLARY ONLY
Chlamydia: NEGATIVE
Comment: NEGATIVE
Comment: NORMAL
Neisseria Gonorrhea: NEGATIVE

## 2024-11-20 ENCOUNTER — Ambulatory Visit: Admitting: Pharmacist
